# Patient Record
Sex: Female | Born: 1955 | Race: White | Hispanic: No | Marital: Married | Smoking: Former smoker
Health system: Southern US, Community
[De-identification: ages and names within clinical notes are randomized; demographics above are authoritative.]

## PROBLEM LIST (undated history)

## (undated) HISTORY — PX: OTHER SURGICAL HISTORY: SHX169

---

## 2012-02-02 ENCOUNTER — Emergency Department: Payer: Self-pay | Admitting: Internal Medicine

## 2012-09-12 ENCOUNTER — Encounter: Payer: Self-pay | Admitting: Family Medicine

## 2012-09-12 ENCOUNTER — Ambulatory Visit (INDEPENDENT_AMBULATORY_CARE_PROVIDER_SITE_OTHER): Payer: BC Managed Care – PPO | Admitting: Family Medicine

## 2012-09-12 VITALS — BP 130/90 | HR 76 | Temp 98.2°F | Ht 66.5 in | Wt 172.0 lb

## 2012-09-12 DIAGNOSIS — L989 Disorder of the skin and subcutaneous tissue, unspecified: Secondary | ICD-10-CM

## 2012-09-12 DIAGNOSIS — F988 Other specified behavioral and emotional disorders with onset usually occurring in childhood and adolescence: Secondary | ICD-10-CM

## 2012-09-12 DIAGNOSIS — K047 Periapical abscess without sinus: Secondary | ICD-10-CM | POA: Insufficient documentation

## 2012-09-12 MED ORDER — METRONIDAZOLE 500 MG PO TABS: 500.0000 mg | ORAL_TABLET | Freq: Three times a day (TID) | ORAL | Status: DC

## 2012-09-12 NOTE — Progress Notes (Signed)
  Subjective:    Patient ID: Robin Fry, female    DOB: 1955/09/03, 57 y.o.   MRN: [id number]  HPI  Very pleasant 57 yo female here to establish care.  ADD- has had formal eval.  On Adderral prescribed by psychiatrist in [place]. Diagnosed 8 years ago.  Symptoms well controlled.  Left dental abscess- multiple root canals and s/p tooth extraction, awaiting dental implant. Given clindamycin but caused HTN and nausea.  PCN allergic.  Afebrile but wonders if she should take something else.  Does have some pain still. No redness or warmth.  Brings in labs today.  CBC, lipid panel, CMET unremarkable.  Has GYN- pap smear normal- 04/2012. Colonoscopy normal in 2010.  Patient Active Problem List   Diagnosis Date Noted  . ADD (attention deficit disorder) 09/12/2012  . Dental abscess 09/12/2012   History reviewed. No pertinent past medical history. Past Surgical History  Procedure Laterality Date  . Novasure     History  Substance Use Topics  . Smoking status: Former Games developer  . Smokeless tobacco: Not on file  . Alcohol Use: Not on file   Family History  Problem Relation Age of Onset  . Osteoporosis Mother   . Diabetes Mother   . Alcohol abuse Father   . Heart disease Father 43    first MI at 84  . Diabetes Father    Allergies  Allergen Reactions  . Clindamycin/Lincomycin Nausea Only and Hypertension  . Penicillins Hives  . Tetracyclines & Related Hives   No current outpatient prescriptions on file prior to visit.   No current facility-administered medications on file prior to visit.   The PMH, PSH, Social History, Family History, Medications, and allergies have been reviewed in [facility], and have been updated if relevant.   Review of Systems See HPI No n/v/d     Objective:   Physical Exam BP 130/90  Pulse 76  Temp(Src) 98.2 F (36.8 C)  Ht 5' 6.5" (1.689 m)  Wt 172 lb (78.019 kg)  BMI 27.35 kg/m2   General:  Well-developed,well-nourished,in no acute distress;  alert,appropriate and cooperative throughout examination Head:  normocephalic and atraumatic.   Eyes:  vision grossly intact, pupils equal, pupils round, and pupils reactive to light.   Ears:  R ear normal and L ear normal.   Nose:  no external deformity.   Mouth:   Mild erythema on gum lower molar, otherwise unremarkable Lungs:  Normal respiratory effort, chest expands symmetrically. Lungs are clear to auscultation, no crackles or wheezes. Heart:  Normal rate and regular rhythm. S1 and S2 normal without gallop, murmur, click, rub or other extra sounds. Abdomen:  Bowel sounds positive,abdomen soft and non-tender without masses, organomegaly or hernias noted. Msk:  No deformity or scoliosis noted of thoracic or lumbar spine.   Extremities:  No clubbing, cyanosis, edema, or deformity noted with normal full range of motion of all joints.   Neurologic:  alert & oriented X3 and gait normal.   Skin:  Multiple AKs Cervical Nodes:  No lymphadenopathy noted Axillary Nodes:  No palpable lymphadenopathy Psych:  Cognition and judgment appear intact. Alert and cooperative with normal attention span and concentration. No apparent delusions, illusions, hallucinations     Assessment & Plan:  1. ADD (attention deficit disorder) Stable on current meds.  Refilled by psychiatry.  2. Dental abscess Will cover with course of Flagyl  3. Skin lesions, generalized Refer to derm. - Ambulatory referral to Dermatology

## 2012-09-12 NOTE — Patient Instructions (Addendum)
It was nice to meet you. Call me in a week or two with an update.  Please stop by to see Shirlee Limerick on your way out to set up dermatology your referral.

## 2013-02-12 ENCOUNTER — Encounter: Payer: Self-pay | Admitting: Internal Medicine

## 2013-02-12 ENCOUNTER — Ambulatory Visit (INDEPENDENT_AMBULATORY_CARE_PROVIDER_SITE_OTHER): Payer: BC Managed Care – PPO | Admitting: Internal Medicine

## 2013-02-12 VITALS — BP 118/80 | HR 106 | Temp 102.4°F | Ht 66.5 in | Wt 170.5 lb

## 2013-02-12 DIAGNOSIS — J069 Acute upper respiratory infection, unspecified: Secondary | ICD-10-CM

## 2013-02-12 LAB — POCT RAPID STREP A (OFFICE): Rapid Strep A Screen: NEGATIVE

## 2013-02-12 LAB — POCT INFLUENZA A/B: Influenza A, POC: NEGATIVE

## 2013-02-12 NOTE — Patient Instructions (Signed)

## 2013-02-12 NOTE — Progress Notes (Signed)
HPI  Pt presents to the clinic today with c/o cough, fatigue, headaches, body aches. The cough is productive with yellow sputum initially but then turns clear . She has been running fevers up to 102.4. This started 2 days ago. She has taken Nyquil, Vit C, Vit A, and ibuprofen- none of which seems to help. She does have a history of allergies and asthma. She has not had sick contacts that she is aware of but she did have recent airplane travel (sunday).  Review of Systems     History reviewed. No pertinent past medical history.  Family History  Problem Relation Age of Onset  . Osteoporosis Mother   . Diabetes Mother   . Alcohol abuse Father   . Heart disease Father 50    first MI at 30  . Diabetes Father     History   Social History  . Marital Status: Single    Spouse Name: N/A    Number of Children: N/A  . Years of Education: N/A   Occupational History  . Not on file.   Social History Main Topics  . Smoking status: Former Games developer  . Smokeless tobacco: Not on file  . Alcohol Use: Not on file  . Drug Use: Not on file  . Sexual Activity: Not on file   Other Topics Concern  . Not on file   Social History Narrative   Engaged.  Works in [employer].          Allergies  Allergen Reactions  . Clindamycin/Lincomycin Nausea Only and Hypertension  . Penicillins Hives  . Tetracyclines & Related Hives     Constitutional: Positive headache, fatigue and fever. Denies abrupt weight changes.  HEENT:  Positive sore throat. Denies eye redness, eye pain, pressure behind the eyes, facial pain, nasal congestion, ear pain, ringing in the ears, wax buildup, runny nose or bloody nose. Respiratory: Positive cough. Denies difficulty breathing or shortness of breath.  Cardiovascular: Denies chest pain, chest tightness, palpitations or swelling in the hands or feet.   No other specific complaints in a complete review of systems (except as listed in HPI above).  Objective:   BP 118/80   Pulse 106  Temp(Src) 102.4 F (39.1 C) (Oral)  Ht 5' 6.5" (1.689 m)  Wt 170 lb 8 oz (77.338 kg)  BMI 27.11 kg/m2  SpO2 98% Wt Readings from Last 3 Encounters:  02/12/13 170 lb 8 oz (77.338 kg)  09/12/12 172 lb (78.019 kg)     General: Appears her stated age, well developed, well nourished in NAD. HEENT: Head: normal shape and size; Eyes: sclera white, no icterus, conjunctiva pink, PERRLA and EOMs intact; Ears: Tm's gray and intact, normal light reflex; Nose: mucosa pink and moist, septum midline; Throat/Mouth: + PND. Teeth present, mucosa erythematous and moist, no exudate noted, no lesions or ulcerations noted.  Neck: Neck supple, trachea midline. No massses, lumps or thyromegaly present.  Cardiovascular: Normal rate and rhythm. S1,S2 noted.  No murmur, rubs or gallops noted. No JVD or BLE edema. No carotid bruits noted. Pulmonary/Chest: Normal effort and positive vesicular breath sounds. No respiratory distress. No wheezes, rales or ronchi noted.      Assessment & Plan:   Upper Respiratory Infection, likely viral:  Will obtain rapid flu-negative Get some rest and drink plenty of water Do salt water gargles for the sore throat Supportive care- tylenol, fluids, rest  RTC as needed or if symptoms persist > 7 days or seem to get worse

## 2013-02-25 ENCOUNTER — Encounter: Payer: Self-pay | Admitting: Family Medicine

## 2013-02-25 ENCOUNTER — Ambulatory Visit (INDEPENDENT_AMBULATORY_CARE_PROVIDER_SITE_OTHER): Payer: BC Managed Care – PPO | Admitting: Family Medicine

## 2013-02-25 VITALS — BP 114/80 | HR 79 | Temp 99.0°F | Ht 66.5 in | Wt 171.8 lb

## 2013-02-25 DIAGNOSIS — J019 Acute sinusitis, unspecified: Secondary | ICD-10-CM

## 2013-02-25 DIAGNOSIS — B9689 Other specified bacterial agents as the cause of diseases classified elsewhere: Secondary | ICD-10-CM | POA: Insufficient documentation

## 2013-02-25 MED ORDER — GUAIFENESIN-CODEINE 100-10 MG/5ML PO SYRP: 5.0000 mL | ORAL_SOLUTION | Freq: Four times a day (QID) | ORAL | Status: DC | PRN

## 2013-02-25 MED ORDER — BENZONATATE 200 MG PO CAPS: 200.0000 mg | ORAL_CAPSULE | Freq: Three times a day (TID) | ORAL | Status: DC | PRN

## 2013-02-25 MED ORDER — AZITHROMYCIN 250 MG PO TABS: ORAL_TABLET | ORAL | Status: DC

## 2013-02-25 NOTE — Progress Notes (Signed)
Subjective:    Patient ID: Robin Fry, female    DOB: 1955-05-16, 57 y.o.   MRN: [id number]  HPI Here for cough and uti symptoms  Was here 2 weeks ago - felt bad with cough and aches (saw Rene Kocher) - with temp of 102 -- then at home with 103  Neg flu test   Dx with viral syndrome  She rested and took care of herself  Took a trip/ (not out of the country)   Overall doing a lot better -but cough persists  Non prod cough Clear mucous  Temp 99 today but no fever symptoms   Took nyquil a few times  cepacol otc as well  Zinc  Vit C Lots of fluids  A little bit of sinus pain - under eyes with congestion   Patient Active Problem List   Diagnosis Date Noted  . Acute bacterial sinusitis 02/25/2013  . ADD (attention deficit disorder) 09/12/2012   No past medical history on file. Past Surgical History  Procedure Laterality Date  . Novasure     History  Substance Use Topics  . Smoking status: Former Games developer  . Smokeless tobacco: Not on file  . Alcohol Use: Not on file   Family History  Problem Relation Age of Onset  . Osteoporosis Mother   . Diabetes Mother   . Alcohol abuse Father   . Heart disease Father 42    first MI at 73  . Diabetes Father    Allergies  Allergen Reactions  . Clindamycin/Lincomycin Nausea Only and Hypertension  . Penicillins Hives  . Tetracyclines & Related Hives   Current Outpatient Prescriptions on File Prior to Visit  Medication Sig Dispense Refill  . amphetamine-dextroamphetamine (ADDERALL XR) 20 MG 24 hr capsule Take 20 mg by mouth every morning.      Marland Kitchen amphetamine-dextroamphetamine (ADDERALL) 5 MG tablet Take 2 by mouth every morning       No current facility-administered medications on file prior to visit.     Review of Systems Review of Systems  Constitutional: Negative for fever, appetite change, fatigue and unexpected weight change.  Eyes: Negative for pain and visual disturbance.  ENT pos for sinus pain and pressure/ neg for st or  ear pain Respiratory: Negative for wheeze  and shortness of breath.   Cardiovascular: Negative for cp or palpitations    Gastrointestinal: Negative for nausea, diarrhea and constipation.  Genitourinary: Negative for urgency and frequency.  Skin: Negative for pallor or rash   Neurological: Negative for weakness, light-headedness, numbness and headaches.  Hematological: Negative for adenopathy. Does not bruise/bleed easily.  Psychiatric/Behavioral: Negative for dysphoric mood. The patient is not nervous/anxious.         Objective:   Physical Exam  Constitutional: She appears well-developed and well-nourished. No distress.  HENT:  Head: Normocephalic and atraumatic.  Right Ear: External ear normal.  Left Ear: External ear normal.  Mouth/Throat: Oropharynx is clear and moist. No oropharyngeal exudate.  Nares are injected and congested  bilat maxillary sinus tenderness  Eyes: Conjunctivae and EOM are normal. Pupils are equal, round, and reactive to light. Right eye exhibits no discharge. Left eye exhibits no discharge.  Neck: Normal range of motion. Neck supple. No JVD present. Carotid bruit is not present. No thyromegaly present.  Cardiovascular: Normal rate and regular rhythm.   Pulmonary/Chest: Effort normal and breath sounds normal. No respiratory distress. She has no wheezes. She has no rales. She exhibits no tenderness.  Lymphadenopathy:    She has no  cervical adenopathy.  Neurological: She is alert. No cranial nerve deficit.  Skin: Skin is warm and dry. No rash noted. No pallor.  Psychiatric: She has a normal mood and affect.          Assessment & Plan:

## 2013-02-25 NOTE — Patient Instructions (Signed)
Drink lots of fluids and try to rest  For cough- use tessalon three times daily For bedtime - try the codeine cough med  Take zpak for sinus infection Update if not starting to improve in a week or if worsening

## 2013-02-26 NOTE — Assessment & Plan Note (Signed)
Cover with zpak Fluids/ rest / Disc symptomatic care - see instructions on AVS  Tessalon for cough  Update if not starting to improve in a week or if worsening

## 2013-03-19 ENCOUNTER — Other Ambulatory Visit: Payer: Self-pay

## 2014-01-19 ENCOUNTER — Ambulatory Visit: Payer: Self-pay | Admitting: Chiropractic Medicine

## 2014-03-08 ENCOUNTER — Ambulatory Visit (INDEPENDENT_AMBULATORY_CARE_PROVIDER_SITE_OTHER): Payer: BC Managed Care – PPO | Admitting: Family Medicine

## 2014-03-08 ENCOUNTER — Ambulatory Visit (INDEPENDENT_AMBULATORY_CARE_PROVIDER_SITE_OTHER)
Admission: RE | Admit: 2014-03-08 | Discharge: 2014-03-08 | Disposition: A | Discharge status: Home / Self Care | Payer: BC Managed Care – PPO | Source: Ambulatory Visit | Attending: Family Medicine | Admitting: Family Medicine

## 2014-03-08 ENCOUNTER — Encounter: Payer: Self-pay | Admitting: Family Medicine

## 2014-03-08 VITALS — BP 140/88 | HR 82 | Temp 98.6°F | Ht 66.5 in | Wt 177.8 lb

## 2014-03-08 DIAGNOSIS — J209 Acute bronchitis, unspecified: Secondary | ICD-10-CM

## 2014-03-08 MED ORDER — BENZONATATE 200 MG PO CAPS: 200.0000 mg | ORAL_CAPSULE | Freq: Three times a day (TID) | ORAL | Status: DC | PRN

## 2014-03-08 MED ORDER — AZITHROMYCIN 250 MG PO TABS: ORAL_TABLET | ORAL | Status: DC

## 2014-03-08 MED ORDER — HYDROCODONE-HOMATROPINE 5-1.5 MG/5ML PO SYRP: 5.0000 mL | ORAL_SOLUTION | Freq: Three times a day (TID) | ORAL | Status: DC | PRN

## 2014-03-08 NOTE — Progress Notes (Signed)
Pre visit review using our clinic review tool, if applicable. No additional management support is needed unless otherwise documented below in the visit note. 

## 2014-03-08 NOTE — Progress Notes (Signed)
Subjective:    Patient ID: Robin Fry, female    DOB: 1955/08/09, 58 y.o.   MRN: [id number]  HPI Here with uri symptoms for almost 2 weeks   Bad cough and lost voice  Throat is sore  Green/yellow with blood in sputum - straining to cough   Non smoker  A little second hand exp from fiancee Quit herself 30 years ago   Congested in sinuses as well  Some pressure over maxillary area-not severe  Runny nose too   Echanesia/ vitamins and zinc Went to [facility] and given cough syrup (dx with viral illness)- bromofed DM -no help She cannot take codeine   Patient Active Problem List   Diagnosis Date Noted  . Acute bronchitis 03/08/2014  . Acute bacterial sinusitis 02/25/2013  . ADD (attention deficit disorder) 09/12/2012   No past medical history on file. Past Surgical History  Procedure Laterality Date  . Novasure     History  Substance Use Topics  . Smoking status: Former Games developermoker  . Smokeless tobacco: Not on file  . Alcohol Use: No   Family History  Problem Relation Age of Onset  . Osteoporosis Mother   . Diabetes Mother   . Alcohol abuse Father   . Heart disease Father 3940    first MI at 7340  . Diabetes Father    Allergies  Allergen Reactions  . Clindamycin/Lincomycin Nausea Only and Hypertension  . Codeine     nightmares  . Penicillins Hives  . Tetracyclines & Related Hives   Current Outpatient Prescriptions on File Prior to Visit  Medication Sig Dispense Refill  . amphetamine-dextroamphetamine (ADDERALL XR) 20 MG 24 hr capsule Take 20 mg by mouth every morning.      Marland Kitchen. amphetamine-dextroamphetamine (ADDERALL) 5 MG tablet Take 2 by mouth every morning       No current facility-administered medications on file prior to visit.    Review of Systems Review of Systems  Constitutional: Negative for fever, appetite change,  and unexpected weight change.  ENT pos for cong and rhinorrhea Eyes: Negative for pain and visual disturbance.  Respiratory: Negative for  wheeze and shortness of breath.  pos for hemoptysis  Cardiovascular: Negative for cp or palpitations    Gastrointestinal: Negative for nausea, diarrhea and constipation.  Genitourinary: Negative for urgency and frequency.  Skin: Negative for pallor or rash   Neurological: Negative for weakness, light-headedness, numbness and headaches.  Hematological: Negative for adenopathy. Does not bruise/bleed easily.  Psychiatric/Behavioral: Negative for dysphoric mood. The patient is not nervous/anxious.         Objective:   Physical Exam  Constitutional: She appears well-developed and well-nourished. No distress.  HENT:  Head: Normocephalic and atraumatic.  Right Ear: External ear normal.  Left Ear: External ear normal.  Mouth/Throat: Oropharynx is clear and moist. No oropharyngeal exudate.  Nares are injected and congested  No sinus tenderness   Eyes: Conjunctivae and EOM are normal. Pupils are equal, round, and reactive to light. Right eye exhibits no discharge. Left eye exhibits no discharge.  Neck: Normal range of motion. Neck supple. No thyromegaly present.  Cardiovascular: Normal rate and regular rhythm.   Pulmonary/Chest: Effort normal and breath sounds normal. No respiratory distress. She has no wheezes. She has no rales. She exhibits no tenderness.  Harsh bs  Scattered rhonchi No wheeze   Lymphadenopathy:    She has no cervical adenopathy.  Neurological: She is alert.  Skin: Skin is warm and dry. No rash noted. No  erythema. No pallor.  Psychiatric: She has a normal mood and affect.          Assessment & Plan:   Problem List Items Addressed This Visit     Respiratory   Acute bronchitis - Primary     Due to hemoptysis (remote smoker)- check cxr today Cover with zithromax Hycodan for pm cough/warned of sedation , tessalon prn tid  Disc symptomatic care - see instructions on AVS  Update if not starting to improve in a week or if worsening      Relevant Orders      DG  Chest 2 View (Completed)

## 2014-03-08 NOTE — Assessment & Plan Note (Signed)
Due to hemoptysis (remote smoker)- check cxr today Cover with zithromax Hycodan for pm cough/warned of sedation , tessalon prn tid  Disc symptomatic care - see instructions on AVS  Update if not starting to improve in a week or if worsening

## 2014-03-08 NOTE — Patient Instructions (Signed)
Drink lots of fluids and rest  Chest xray today and we will get results to you  Take the zithromax as directed Tessalon for cough as needed  Hycodan syrup for cough as needed -watching out for sedation  Update if not starting to improve in a week or if worsening

## 2014-03-11 ENCOUNTER — Telehealth: Payer: Self-pay | Admitting: Family Medicine

## 2014-03-11 ENCOUNTER — Encounter: Payer: Self-pay | Admitting: Family Medicine

## 2014-03-11 MED ORDER — AZITHROMYCIN 250 MG PO TABS: ORAL_TABLET | ORAL | Status: DC

## 2014-03-11 NOTE — Telephone Encounter (Signed)
Patient Information:  Caller Name: Karoline Caldwellngie  Phone: [phone]  Patient: Robin Fry, Robin Fry  Gender: Female  DOB: [birth year]  Age: 89 Years  PCP: Roxy Mannsower, [facility]  Office Follow Up:  Does the office need to follow up with this patient?: Yes  Instructions For The Office: PLEASE CONTACT PT WITH PCP ADVICE ON ADDITIONAL MEDICATION  RN Note:  Pt was seen in office on 10/26.  She is calling back with symptoms of cough, very hoarse, bright yellow mucus.  Pt is concerned that she needs more antibiotic.  She will complete the Z-pack on 10/30 and feels like symptoms have improved while taking medication.  She is concerned because she plans to travel by airplane next week.   OFFICE NOTE:PT IS REQUESTING ADDITIONAL ANTIBIOTICS  Symptoms  Reason For Call & Symptoms: Hoarness, cough,  Reviewed Health History In EMR: Yes  Reviewed Medications In EMR: Yes  Reviewed Allergies In EMR: Yes  Reviewed Surgeries / Procedures: Yes  Date of Onset of Symptoms: 03/08/2014  Treatments Tried: vitamins and saline washes  Treatments Tried Worked: Yes  Guideline(s) Used:  Cough  Disposition Per Guideline:   See Within 3 Days in Office  Reason For Disposition Reached:   Nasal discharge present > 10 days  Advice Given:  Call Back If:  You become worse.  RN Overrode Recommendation:  Patient Requests Prescription  PT IS REQUESTING ADDITIONAL ANTIBIOTICS

## 2014-03-11 NOTE — Telephone Encounter (Signed)
Rx sent and I left voicemail letting pt know Rx sent but don't take it if she doesn't need it

## 2014-03-11 NOTE — Telephone Encounter (Signed)
Shapale will monitor response to notify pt.

## 2014-03-11 NOTE — Telephone Encounter (Signed)
Please call in another zpack- tell her not to take it if she does not need it  Thanks

## 2014-04-29 ENCOUNTER — Encounter: Payer: Self-pay | Admitting: Family Medicine

## 2014-04-29 ENCOUNTER — Ambulatory Visit (INDEPENDENT_AMBULATORY_CARE_PROVIDER_SITE_OTHER): Payer: BC Managed Care – PPO | Admitting: Family Medicine

## 2014-04-29 VITALS — BP 126/70 | HR 60 | Temp 98.0°F | Wt 180.0 lb

## 2014-04-29 DIAGNOSIS — G5601 Carpal tunnel syndrome, right upper limb: Secondary | ICD-10-CM

## 2014-04-29 DIAGNOSIS — G5602 Carpal tunnel syndrome, left upper limb: Secondary | ICD-10-CM

## 2014-04-29 DIAGNOSIS — G5600 Carpal tunnel syndrome, unspecified upper limb: Secondary | ICD-10-CM | POA: Insufficient documentation

## 2014-04-29 DIAGNOSIS — M791 Myalgia: Secondary | ICD-10-CM

## 2014-04-29 DIAGNOSIS — IMO0001 Reserved for inherently not codable concepts without codable children: Secondary | ICD-10-CM

## 2014-04-29 DIAGNOSIS — M609 Myositis, unspecified: Secondary | ICD-10-CM

## 2014-04-29 DIAGNOSIS — G5603 Carpal tunnel syndrome, bilateral upper limbs: Secondary | ICD-10-CM

## 2014-04-29 LAB — COMPREHENSIVE METABOLIC PANEL
ALT: 21 U/L (ref 0–35)
AST: 19 U/L (ref 0–37)
Albumin: 4.5 g/dL (ref 3.5–5.2)
Alkaline Phosphatase: 66 U/L (ref 39–117)
BILIRUBIN TOTAL: 0.6 mg/dL (ref 0.2–1.2)
BUN: 9 mg/dL (ref 6–23)
CALCIUM: 9.8 mg/dL (ref 8.4–10.5)
CHLORIDE: 106 meq/L (ref 96–112)
CO2: 28 meq/L (ref 19–32)
CREATININE: 0.8 mg/dL (ref 0.4–1.2)
GFR: 84.53 mL/min (ref >60.00)
GLUCOSE: 92 mg/dL (ref 70–99)
Potassium: 4 mEq/L (ref 3.5–5.1)
Sodium: 141 mEq/L (ref 135–145)
Total Protein: 7.1 g/dL (ref 6.0–8.3)

## 2014-04-29 LAB — C-REACTIVE PROTEIN: CRP: <0.5 mg/dL (ref 0.5–20.0)

## 2014-04-29 LAB — SEDIMENTATION RATE: SED RATE: 10 mm/h (ref 0–22)

## 2014-04-29 NOTE — Assessment & Plan Note (Signed)
Neck pain also likely due to spasm from her posture at work. Will check labs to rule out rheum issue. Orders Placed This Encounter  Procedures  . Sedimentation Rate  . Rheumatoid factor  . Cyclic Citrul Peptide Antibody, IGG  . C-reactive protein  . Comprehensive metabolic panel

## 2014-04-29 NOTE — Assessment & Plan Note (Signed)
New- discussed supportive care- hand out given from sports med advisor. Advised wrist splints bilaterally at night. Also given note to give to employer to make her station more ergonomic.  Call or return to clinic prn if these symptoms worsen or fail to improve as anticipated. The patient indicates understanding of these issues and agrees with the plan.

## 2014-04-29 NOTE — Patient Instructions (Signed)
Good to see you and happy holidays. I will call you with your lab results.

## 2014-04-29 NOTE — Progress Notes (Addendum)
Subjective:   Patient ID: Robin Fry, female    DOB: 1956/04/20, 58 y.o.   MRN: [id number]  Robin Fry is a pleasant 58 y.o. year old female who presents to clinic today with Generalized Body Aches  on 04/29/2014  HPI:  Has been waking up at night and morning past several months with pain/tingling in her forearms bilaterally and tingling in her fingers.  She can just "shake it out" typically.  Also has been having some neck and foot pain.  Massage has helped somewhat. +family history of OA, no other types of arthritis that she is aware of. No fevers, chills or night sweats.  No nausea or vomiting.  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  Current Outpatient Prescriptions on File Prior to Visit  Medication Sig Dispense Refill  . amphetamine-dextroamphetamine (ADDERALL XR) 20 MG 24 hr capsule Take 20 mg by mouth every morning.    Marland Kitchen. amphetamine-dextroamphetamine (ADDERALL) 5 MG tablet Take 2 by mouth every morning     No current facility-administered medications on file prior to visit.    Allergies  Allergen Reactions  . Clindamycin/Lincomycin Nausea Only and Hypertension  . Codeine     nightmares  . Penicillins Hives  . Tetracyclines & Related Hives    History reviewed. No pertinent past medical history.  Past Surgical History  Procedure Laterality Date  . Novasure      Family History  Problem Relation Age of Onset  . Osteoporosis Mother   . Diabetes Mother   . Alcohol abuse Father   . Heart disease Father 7240    first MI at 140  . Diabetes Father     History   Social History  . Marital Status: Single    Spouse Name: N/A    Number of Children: N/A  . Years of Education: N/A   Occupational History  . Not on file.   Social History Main Topics  . Smoking status: Former Games developermoker  . Smokeless tobacco: Not on file  . Alcohol Use:  No  . Drug Use: No  . Sexual Activity: Not on file   Other Topics Concern  . Not on file   Social History Narrative   Engaged.  Works in [employer].         The PMH, PSH, Social History, Family History, Medications, and allergies [facility], and have been updated if relevant.   Review of Systems  Constitutional: Negative for fever and fatigue.  HENT: Negative.   Eyes: Negative.   Respiratory: Negative.   Cardiovascular: Negative.   Gastrointestinal: Negative.   Endocrine: Negative.   Genitourinary: Negative.   Musculoskeletal: Positive for myalgias, back pain, arthralgias, neck pain and neck stiffness. Negative for joint swelling and gait problem.  Skin: Negative.   Neurological: Negative.   Hematological: Negative.   Psychiatric/Behavioral: Negative.   All other systems reviewed and are negative.      Objective:    BP 126/70 mmHg  Pulse 60  Temp(Src) 98 F (36.7 C) (Oral)  Wt 180 lb (81.647 kg)  SpO2 98%   Physical Exam  Constitutional: She is oriented to person, place, and time. She appears well-developed and well-nourished. No distress.  HENT:  Head: Normocephalic.  Eyes: Pupils are equal, round, and reactive to light.  Neck: Normal range of motion. Neck supple.  Cardiovascular:  Normal rate.   Pulmonary/Chest: Effort normal.  Musculoskeletal:       Cervical back: She exhibits spasm. She exhibits normal range of motion, no tenderness, no bony tenderness and no swelling.  +phalen, good grip strength bilaterally  Neurological: She is alert and oriented to person, place, and time. No cranial nerve deficit. Coordination normal.  Skin: Skin is warm and dry.  Psychiatric: She has a normal mood and affect. Her behavior is normal. Judgment and thought content normal.          Assessment & Plan:   Bilateral carpal tunnel syndrome  Myalgia and myositis - Plan: Sedimentation Rate, Rheumatoid factor, Cyclic Citrul Peptide Antibody, IGG, C-reactive  protein, Comprehensive metabolic panel No Follow-up on file.

## 2014-04-29 NOTE — Progress Notes (Signed)
Pre visit review using our clinic review tool, if applicable. No additional management support is needed unless otherwise documented below in the visit note. 

## 2014-04-30 LAB — RHEUMATOID FACTOR: Rhuematoid fact SerPl-aCnc: <10 IU/mL (ref <=14)

## 2014-04-30 LAB — CYCLIC CITRUL PEPTIDE ANTIBODY, IGG: Cyclic Citrullin Peptide Ab: <2 U/mL (ref 0.0–5.0)

## 2014-07-15 ENCOUNTER — Other Ambulatory Visit: Payer: Self-pay | Admitting: Family Medicine

## 2014-07-15 DIAGNOSIS — Z01419 Encounter for gynecological examination (general) (routine) without abnormal findings: Secondary | ICD-10-CM

## 2014-07-16 ENCOUNTER — Other Ambulatory Visit (INDEPENDENT_AMBULATORY_CARE_PROVIDER_SITE_OTHER): Payer: BLUE CROSS/BLUE SHIELD

## 2014-07-16 DIAGNOSIS — Z0000 Encounter for general adult medical examination without abnormal findings: Secondary | ICD-10-CM

## 2014-07-16 DIAGNOSIS — Z01419 Encounter for gynecological examination (general) (routine) without abnormal findings: Secondary | ICD-10-CM

## 2014-07-16 LAB — TSH: TSH: 2.32 u[IU]/mL (ref 0.35–4.50)

## 2014-07-16 LAB — COMPREHENSIVE METABOLIC PANEL
ALT: 24 U/L (ref 0–35)
AST: 20 U/L (ref 0–37)
Albumin: 4.5 g/dL (ref 3.5–5.2)
Alkaline Phosphatase: 66 U/L (ref 39–117)
BUN: 12 mg/dL (ref 6–23)
CALCIUM: 9.8 mg/dL (ref 8.4–10.5)
CHLORIDE: 104 meq/L (ref 96–112)
CO2: 30 meq/L (ref 19–32)
Creatinine, Ser: 0.79 mg/dL (ref 0.40–1.20)
GFR: 79.55 mL/min (ref >60.00)
Glucose, Bld: 91 mg/dL (ref 70–99)
Potassium: 4.6 mEq/L (ref 3.5–5.1)
Sodium: 138 mEq/L (ref 135–145)
Total Bilirubin: 0.7 mg/dL (ref 0.2–1.2)
Total Protein: 7 g/dL (ref 6.0–8.3)

## 2014-07-16 LAB — CBC WITH DIFFERENTIAL/PLATELET
BASOS ABS: 0 10*3/uL (ref 0.0–0.1)
Basophils Relative: 0.4 % (ref 0.0–3.0)
EOS PCT: 2.5 % (ref 0.0–5.0)
Eosinophils Absolute: 0.1 10*3/uL (ref 0.0–0.7)
HEMATOCRIT: 40.8 % (ref 36.0–46.0)
Hemoglobin: 14.1 g/dL (ref 12.0–15.0)
LYMPHS ABS: 1.8 10*3/uL (ref 0.7–4.0)
Lymphocytes Relative: 34.4 % (ref 12.0–46.0)
MCHC: 34.4 g/dL (ref 30.0–36.0)
MCV: 86.7 fl (ref 78.0–100.0)
Monocytes Absolute: 0.4 10*3/uL (ref 0.1–1.0)
Monocytes Relative: 8.8 % (ref 3.0–12.0)
NEUTROS PCT: 53.9 % (ref 43.0–77.0)
Neutro Abs: 2.8 10*3/uL (ref 1.4–7.7)
PLATELETS: 241 10*3/uL (ref 150.0–400.0)
RBC: 4.71 Mil/uL (ref 3.87–5.11)
RDW: 13.1 % (ref 11.5–15.5)
WBC: 5.1 10*3/uL (ref 4.0–10.5)

## 2014-07-16 LAB — LIPID PANEL
Cholesterol: 184 mg/dL (ref 0–200)
HDL: 49.1 mg/dL (ref >39.00)
LDL Cholesterol: 109 mg/dL — ABNORMAL HIGH (ref 0–99)
NONHDL: 134.9
Total CHOL/HDL Ratio: 4
Triglycerides: 130 mg/dL (ref 0.0–149.0)
VLDL: 26 mg/dL (ref 0.0–40.0)

## 2014-07-16 LAB — VITAMIN B12: Vitamin B-12: 365 pg/mL (ref 211–911)

## 2014-07-22 ENCOUNTER — Ambulatory Visit (INDEPENDENT_AMBULATORY_CARE_PROVIDER_SITE_OTHER): Payer: BLUE CROSS/BLUE SHIELD | Admitting: Family Medicine

## 2014-07-22 ENCOUNTER — Encounter: Payer: Self-pay | Admitting: Family Medicine

## 2014-07-22 VITALS — BP 128/78 | HR 67 | Temp 98.2°F | Ht 66.25 in | Wt 184.5 lb

## 2014-07-22 DIAGNOSIS — E66811 Obesity, class 1: Secondary | ICD-10-CM | POA: Insufficient documentation

## 2014-07-22 DIAGNOSIS — Z0000 Encounter for general adult medical examination without abnormal findings: Secondary | ICD-10-CM

## 2014-07-22 DIAGNOSIS — Z01419 Encounter for gynecological examination (general) (routine) without abnormal findings: Secondary | ICD-10-CM | POA: Insufficient documentation

## 2014-07-22 DIAGNOSIS — E669 Obesity, unspecified: Secondary | ICD-10-CM | POA: Insufficient documentation

## 2014-07-22 NOTE — Assessment & Plan Note (Signed)
Reviewed preventive care protocols, scheduled due services, and updated immunizations Discussed nutrition, exercise, diet, and healthy lifestyle.  Declines influenza vaccine. 

## 2014-07-22 NOTE — Progress Notes (Signed)
   Subjective:   Patient ID: Arnoldo Moralengie Sandlin, female    DOB: 11-10-1955, 59 y.o.   MRN: [id number]  Arnoldo Moralengie Czerwinski is a pleasant 59 y.o. year old female who presents to clinic today with Annual Exam  on 07/22/2014  HPI:  Has a GYN- sees him yearly in [place]- last saw him in September 2015. Remote h/o ablation. Due for mammogram in 01/2015.  Colonoscopy done 6 year ago per pt, in [place]- was normal.  Weight gain- Stress eater and has been under more stress lately at work and at home.  Also not exercising at all.  Works from early in the morning until evening without leaving her desk.  Feeling "fat" and she knows it affects her mood too.  Denies feeling overtly depressed or anxious but it does make her feel more "down."  Used to run or walk her dogs regularly.  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  Plans on talking with her employer to get a new keyboard.  Lab Results  Component Value Date   WBC 5.1 07/16/2014   HGB 14.1 07/16/2014   HCT 40.8 07/16/2014   MCV 86.7 07/16/2014   PLT 241.0 07/16/2014   Lab Results  Component Value Date   CREATININE 0.79 07/16/2014   Lab Results  Component Value Date   TSH 2.32 07/16/2014   Lab Results  Component Value Date   CHOL 184 07/16/2014   HDL 49.10 07/16/2014   LDLCALC 109* 07/16/2014   TRIG 130.0 07/16/2014   CHOLHDL 4 07/16/2014   Lab Results  Component Value Date   ALT 24 07/16/2014   AST 20 07/16/2014   ALKPHOS 66 07/16/2014   BILITOT 0.7 07/16/2014     . Review of Systems  Constitutional: Negative.   HENT: Negative.   Eyes: Negative.   Respiratory: Negative.   Cardiovascular: Negative.   Gastrointestinal: Negative.   Genitourinary: Negative.   Musculoskeletal: Positive for arthralgias.  Skin: Negative.   Allergic/Immunologic: Negative.   Neurological: Negative.   Hematological: Negative.     Psychiatric/Behavioral: Negative.   All other systems reviewed and are negative.      Objective:    BP 128/78 mmHg  Pulse 67  Temp(Src) 98.2 F (36.8 C) (Oral)  Ht 5' 6.25" (1.683 m)  Wt 184 lb 8 oz (83.689 kg)  BMI 29.55 kg/m2  SpO2 97%   Physical Exam  Constitutional: She is oriented to person, place, and time. She appears well-developed and well-nourished. No distress.  HENT:  Head: Normocephalic.  Eyes: Conjunctivae are normal.  Neck: Normal range of motion. Neck supple. No thyromegaly present.  Cardiovascular: Normal rate and regular rhythm.   Pulmonary/Chest: Effort normal and breath sounds normal. No respiratory distress.  Abdominal: Soft.  Musculoskeletal: Normal range of motion.  Neurological: She is alert and oriented to person, place, and time. No cranial nerve deficit.  Skin: Skin is warm and dry.  Psychiatric: She has a normal mood and affect. Her behavior is normal. Judgment and thought content normal.  Nursing note and vitals reviewed.         Assessment & Plan:   Well woman exam No Follow-up on file.

## 2014-07-22 NOTE — Patient Instructions (Addendum)
Good to see you. Please look for you doctor's information who did colonoscopy when you get home and call us back.  Doctor's orders-  You must exercise at least 30 minutes a day at lunch.  Please start taking a Vit B12 supplement- 500 - 1000 mcg per day.

## 2014-07-22 NOTE — Assessment & Plan Note (Signed)
Discussed scheduling exercise into her schedule at work every day- 30 minutes minimum at lunch.

## 2014-07-22 NOTE — Progress Notes (Signed)
Pre visit review using our clinic review tool, if applicable. No additional management support is needed unless otherwise documented below in the visit note. 

## 2014-11-19 ENCOUNTER — Telehealth: Payer: Self-pay | Admitting: Family Medicine

## 2014-11-19 NOTE — Telephone Encounter (Signed)
Made 30 min

## 2014-11-19 NOTE — Telephone Encounter (Signed)
Yes please make it a 30 minute appt. Thanks.

## 2014-11-19 NOTE — Telephone Encounter (Signed)
Dr Dayton MartesAron Ms Robin HoardBoone called wanting to make and appointment.  Stated it was personal, she stated it would not take long.  I made her a 15 min appointment on 11/25/14.  That's all you had.  Do you want me to make it a 30 min appointment Thanks  robin

## 2014-11-25 ENCOUNTER — Encounter: Payer: Self-pay | Admitting: Family Medicine

## 2014-11-25 ENCOUNTER — Ambulatory Visit (INDEPENDENT_AMBULATORY_CARE_PROVIDER_SITE_OTHER): Payer: BLUE CROSS/BLUE SHIELD | Admitting: Family Medicine

## 2014-11-25 VITALS — BP 130/82 | HR 68 | Temp 98.3°F | Wt 181.0 lb

## 2014-11-25 DIAGNOSIS — F329 Major depressive disorder, single episode, unspecified: Secondary | ICD-10-CM | POA: Diagnosis not present

## 2014-11-25 DIAGNOSIS — R5383 Other fatigue: Secondary | ICD-10-CM | POA: Diagnosis not present

## 2014-11-25 DIAGNOSIS — F909 Attention-deficit hyperactivity disorder, unspecified type: Secondary | ICD-10-CM | POA: Diagnosis not present

## 2014-11-25 DIAGNOSIS — N951 Menopausal and female climacteric states: Secondary | ICD-10-CM

## 2014-11-25 DIAGNOSIS — F32A Depression, unspecified: Secondary | ICD-10-CM

## 2014-11-25 DIAGNOSIS — F988 Other specified behavioral and emotional disorders with onset usually occurring in childhood and adolescence: Secondary | ICD-10-CM

## 2014-11-25 LAB — CBC WITH DIFFERENTIAL/PLATELET
BASOS ABS: 0 10*3/uL (ref 0.0–0.1)
BASOS PCT: 0.3 % (ref 0.0–3.0)
EOS PCT: 2.2 % (ref 0.0–5.0)
Eosinophils Absolute: 0.1 10*3/uL (ref 0.0–0.7)
HEMATOCRIT: 43.9 % (ref 36.0–46.0)
Hemoglobin: 14.9 g/dL (ref 12.0–15.0)
Lymphocytes Relative: 33.5 % (ref 12.0–46.0)
Lymphs Abs: 2.1 10*3/uL (ref 0.7–4.0)
MCHC: 33.9 g/dL (ref 30.0–36.0)
MCV: 87.8 fl (ref 78.0–100.0)
MONO ABS: 0.4 10*3/uL (ref 0.1–1.0)
Monocytes Relative: 6.3 % (ref 3.0–12.0)
NEUTROS ABS: 3.6 10*3/uL (ref 1.4–7.7)
Neutrophils Relative %: 57.7 % (ref 43.0–77.0)
PLATELETS: 279 10*3/uL (ref 150.0–400.0)
RBC: 4.99 Mil/uL (ref 3.87–5.11)
RDW: 12.9 % (ref 11.5–15.5)
WBC: 6.2 10*3/uL (ref 4.0–10.5)

## 2014-11-25 LAB — LUTEINIZING HORMONE: LH: 29.63 m[IU]/mL

## 2014-11-25 LAB — TSH: TSH: 2.68 u[IU]/mL (ref 0.35–4.50)

## 2014-11-25 LAB — VITAMIN B12: Vitamin B-12: 474 pg/mL (ref 211–911)

## 2014-11-25 LAB — FOLLICLE STIMULATING HORMONE: FSH: 113.1 m[IU]/mL

## 2014-11-25 MED ORDER — AMPHETAMINE-DEXTROAMPHET ER 20 MG PO CP24: 20.0000 mg | ORAL_CAPSULE | ORAL | Status: DC

## 2014-11-25 MED ORDER — AMPHETAMINE-DEXTROAMPHETAMINE 5 MG PO TABS: ORAL_TABLET | ORAL | Status: AC

## 2014-11-25 NOTE — Patient Instructions (Signed)
Great to see you. Please restart Adderall.  Please call to make an appointment with a new psychiatrist.  We will call you with your lab results.

## 2014-11-25 NOTE — Assessment & Plan Note (Signed)
Deteriorated off rx. Discussed importance of getting managed by one provider.  Since she is looking for new psychiatrist, will refill one time only. The patient indicates understanding of these issues and agrees with the plan.

## 2014-11-25 NOTE — Progress Notes (Signed)
Pre visit review using our clinic review tool, if applicable. No additional management support is needed unless otherwise documented below in the visit note. 

## 2014-11-25 NOTE — Assessment & Plan Note (Signed)
With fatigue, symptoms of depression. >25 minutes spent in face to face time with patient, >50% spent in counselling or coordination of care Agreed to check labs today, discussed starting lexapro for vasomotor symptoms which will will not do yet.  Hopefully restarting adderall will help as well. Call or return to clinic prn if these symptoms worsen or fail to improve as anticipated. The patient indicates understanding of these issues and agrees with the plan.

## 2014-11-25 NOTE — Progress Notes (Signed)
Subjective:   Patient ID: Robin Fry, female    DOB: [birth year], 59 y.o.   MRN: [id number]  Robin Fry is a pleasant 59 y.o. year old female who presents to clinic today with Fatigue and lack of motivation  on 11/25/2014  HPI:  Past few months, fatigue, anhedonia, hot flashes and vaginal dryness.  More anxious and tearful.  Not concentrating well but ran out of her Adderall and her psychiatrist as left her practice.  She is looking for another one currently.  Not sleeping well. She does not have a personal or family history of anxiety or depression.  No SI or HI.  Appetite is good.  Not having periods- remote h/o ablation.  Current Outpatient Prescriptions on File Prior to Visit  Medication Sig Dispense Refill  . Eflornithine HCl (VANIQA) 13.9 % cream Apply topically 2 (two) times daily with a meal.     No current facility-administered medications on file prior to visit.    Allergies  Allergen Reactions  . Clindamycin/Lincomycin Nausea Only and Hypertension  . Codeine     nightmares  . Penicillins Hives  . Tetracyclines & Related Hives    No past medical history on file.  Past Surgical History  Procedure Laterality Date  . Novasure      Family History  Problem Relation Age of Onset  . Osteoporosis Mother   . Diabetes Mother   . Alcohol abuse Father   . Heart disease Father 15    first MI at 14  . Diabetes Father     History   Social History  . Marital Status: Single    Spouse Name: N/A  . Number of Children: N/A  . Years of Education: N/A   Occupational History  . Not on file.   Social History Main Topics  . Smoking status: Former Games developer  . Smokeless tobacco: Not on file  . Alcohol Use: No  . Drug Use: No  . Sexual Activity: Not on file   Other Topics Concern  . Not on file   Social History Narrative   Engaged.  Works in [employer].         The PMH, PSH, Social History, Family History, Medications, and allergies have been reviewed in [facility],  and have been updated if relevant.  Review of Systems  Constitutional: Positive for fatigue.  Eyes: Negative.   Respiratory: Negative.   Gastrointestinal: Negative.   Genitourinary: Negative for vaginal bleeding and vaginal pain.  Psychiatric/Behavioral: Positive for sleep disturbance, dysphoric mood and decreased concentration. Negative for suicidal ideas, behavioral problems and confusion. The patient is nervous/anxious.   All other systems reviewed and are negative.      Objective:    BP 130/82 mmHg  Pulse 68  Temp(Src) 98.3 F (36.8 C) (Oral)  Wt 181 lb (82.101 kg)  SpO2 98%   Physical Exam  Constitutional: She is oriented to person, place, and time. She appears well-developed and well-nourished. No distress.  HENT:  Head: Normocephalic.  Eyes: Conjunctivae are normal.  Neck: Normal range of motion.  Cardiovascular: Normal rate, regular rhythm and normal heart sounds.   Pulmonary/Chest: Effort normal and breath sounds normal. No respiratory distress. She has no wheezes.  Musculoskeletal: She exhibits no edema.  Neurological: She is alert and oriented to person, place, and time. No cranial nerve deficit.  Skin: Skin is warm and dry.  Psychiatric: She has a normal mood and affect. Her behavior is normal. Judgment and thought content normal.  Nursing note and  vitals reviewed.         Assessment & Plan:   Fatigue due to depression - Plan: TSH, CBC with Differential/Platelet, Vitamin B12  ADD (attention deficit disorder)  Menopausal symptoms - Plan: Luteinizing hormone, Follicle Stimulating Hormone No Follow-up on file.

## 2014-11-26 ENCOUNTER — Encounter: Payer: Self-pay | Admitting: Family Medicine

## 2014-12-07 ENCOUNTER — Other Ambulatory Visit: Payer: Self-pay | Admitting: Family Medicine

## 2014-12-07 MED ORDER — ESCITALOPRAM OXALATE 10 MG PO TABS: 10.0000 mg | ORAL_TABLET | Freq: Every day | ORAL | Status: DC

## 2015-01-10 ENCOUNTER — Telehealth: Payer: Self-pay

## 2015-01-10 NOTE — Telephone Encounter (Signed)
Called patient to remind her of being due for a Mammogram. I was unable to leave a voicemail on both cell and home phone numbers.

## 2015-01-11 ENCOUNTER — Encounter: Payer: Self-pay | Admitting: Family Medicine

## 2015-01-28 ENCOUNTER — Other Ambulatory Visit: Payer: Self-pay | Admitting: *Deleted

## 2015-01-28 MED ORDER — ESCITALOPRAM OXALATE 10 MG PO TABS: 10.0000 mg | ORAL_TABLET | Freq: Every day | ORAL | Status: DC

## 2015-01-28 NOTE — Addendum Note (Signed)
Addended by: Patience Musca on: 01/28/2015 12:52 PM   Modules accepted: Orders

## 2015-01-28 NOTE — Telephone Encounter (Addendum)
[facility] left v/m t;hat ins is requiring 90 day rx for pt;[facility] d/c # 30 x 5 on escitalopram and recent # 90 X 1 per protocol.

## 2015-07-06 ENCOUNTER — Encounter: Payer: Self-pay | Admitting: Family Medicine

## 2015-07-06 ENCOUNTER — Ambulatory Visit (INDEPENDENT_AMBULATORY_CARE_PROVIDER_SITE_OTHER)
Admission: RE | Admit: 2015-07-06 | Discharge: 2015-07-06 | Disposition: A | Discharge status: Home / Self Care | Payer: BLUE CROSS/BLUE SHIELD | Source: Ambulatory Visit | Attending: Family Medicine | Admitting: Family Medicine

## 2015-07-06 ENCOUNTER — Ambulatory Visit
Admission: RE | Admit: 2015-07-06 | Discharge: 2015-07-06 | Disposition: A | Discharge status: Home / Self Care | Payer: BLUE CROSS/BLUE SHIELD | Source: Ambulatory Visit | Attending: Family Medicine | Admitting: Family Medicine

## 2015-07-06 ENCOUNTER — Ambulatory Visit (INDEPENDENT_AMBULATORY_CARE_PROVIDER_SITE_OTHER): Payer: BLUE CROSS/BLUE SHIELD | Admitting: Family Medicine

## 2015-07-06 VITALS — BP 120/80 | HR 76 | Temp 98.5°F | Ht 66.25 in | Wt 174.5 lb

## 2015-07-06 DIAGNOSIS — M25561 Pain in right knee: Secondary | ICD-10-CM | POA: Diagnosis not present

## 2015-07-06 DIAGNOSIS — M25562 Pain in left knee: Secondary | ICD-10-CM

## 2015-07-06 DIAGNOSIS — M7651 Patellar tendinitis, right knee: Secondary | ICD-10-CM | POA: Diagnosis not present

## 2015-07-06 MED ORDER — DICLOFENAC SODIUM 75 MG PO TBEC: 75.0000 mg | DELAYED_RELEASE_TABLET | Freq: Two times a day (BID) | ORAL | Status: DC

## 2015-07-06 NOTE — Progress Notes (Signed)
Dr. Karleen Hampshire T. Nahome Bublitz, MD, CAQ Sports Medicine Primary Care and Sports Medicine [address] [place], [address] Phone: [phone] Fax: [phone]  07/06/2015  Patient: Robin Fry, MRN: [id number], DOB: 1956-03-14, 60 y.o.  Primary Physician:  Ruthe Mannan, MD   Chief Complaint  Patient presents with  . Knee Pain    Bilateral   Subjective:   Robin Fry is a 60 y.o. very pleasant female patient who presents with the following:  A few weeks ago and kneeling down in the anterior. Does not hurt that much - will sometimes get hot.  She primarily has HEENT directly in the anterior aspect of her knee on the right. She has not had any effusion. No mechanical buckling or giving way. When she is having it flexing when she is directly on her knee, doesn't really bother her too much. She is not having the pain in the joint lines. No pain in the patella itself.  Pilates / yoga Crossfit once a week.  R knee, distal patellar insertion injury / tendonitis  Past Medical History, Surgical History, Social History, Family History, Problem List, Medications, and Allergies have been reviewed and updated if relevant.  Patient Active Problem List   Diagnosis Date Noted  . Fatigue 11/25/2014  . Menopausal symptoms 11/25/2014  . Well woman exam 07/22/2014  . Obesity (BMI 30.0-34.9) 07/22/2014  . Myalgia and myositis 04/29/2014  . Carpal tunnel syndrome 04/29/2014  . ADD (attention deficit disorder) 09/12/2012    No past medical history on file.  Past Surgical History  Procedure Laterality Date  . Novasure      Social History   Social History  . Marital Status: Single    Spouse Name: N/A  . Number of Children: N/A  . Years of Education: N/A   Occupational History  . Not on file.   Social History Main Topics  . Smoking status: Former Games developer  . Smokeless tobacco: Never Used  . Alcohol Use: No  . Drug Use: No  . Sexual Activity: Not on file   Other Topics Concern  .  Not on file   Social History Narrative   Engaged.  Works in [employer].          Family History  Problem Relation Age of Onset  . Osteoporosis Mother   . Diabetes Mother   . Alcohol abuse Father   . Heart disease Father 39    first MI at 77  . Diabetes Father     Allergies  Allergen Reactions  . Clindamycin/Lincomycin Nausea Only and Hypertension  . Codeine     nightmares  . Penicillins Hives  . Tetracyclines & Related Hives    Medication list reviewed and updated in full in [facility].  GEN: No fevers, chills. Nontoxic. Primarily MSK c/o today. MSK: Detailed in the HPI GI: tolerating PO intake without difficulty Neuro: No numbness, parasthesias, or tingling associated. Otherwise the pertinent positives of the ROS are noted above.   Objective:   BP 120/80 mmHg  Pulse 76  Temp(Src) 98.5 F (36.9 C) (Oral)  Ht 5' 6.25" (1.683 m)  Wt 174 lb 8 oz (79.153 kg)  BMI 27.94 kg/m2   GEN: WDWN, NAD, Non-toxic, Alert & Oriented x 3 HEENT: Atraumatic, Normocephalic.  Ears and Nose: No external deformity. EXTR: No clubbing/cyanosis/edema NEURO: Normal gait.  PSYCH: Normally interactive. Conversant. Not depressed or anxious appearing.  Calm demeanor.   Knee:  R Gait: Normal heel toe pattern ROM: 0 -  130 Effusion: neg Echymosis or edema: none Patellar tendon  Tender to palpation at the most distal aspect adjacent to the tibial tubercle Painful PLICA: neg Patellar grind: negative Medial and lateral patellar facet loading: negative medial and lateral joint lines:NT Mcmurray's neg Flexion-pinch neg Varus and valgus stress: stable Lachman: neg Ant and Post drawer: neg Hip abduction, IR, ER: WNL Hip flexion str: 5/5 Hip abd: 5/5 Quad: 5/5 VMO atrophy:No Hamstring concentric and eccentric: 5/5   Radiology:  Assessment and Plan:   Patellar tendinitis of right knee  Bilateral knee pain - Plan: DG Knee AP/LAT W/Sunrise Left, DG Knee AP/LAT W/Sunrise  Right   most likely , I think based on the patient's history and clinical exam that she has injured her patellar tendon at the distal insertion , probably mildly. This may be a small horizontal tear or small insertional tear. Likely this will improve with time. I gave her a rehabilitation program with eccentric's and asked her to avoid ballistic movements.  I reviewed with the patient a handout from [employer] and [facility] regarding their condition and a set of home exercises.    Follow-up: if not better in 1 month  New Prescriptions   DICLOFENAC (VOLTAREN) 75 MG EC TABLET    Take 1 tablet (75 mg total) by mouth 2 (two) times daily.   Modified Medications   No medications on file   Orders Placed This Encounter  Procedures  . DG Knee AP/LAT W/Sunrise Left  . DG Knee AP/LAT W/Sunrise Right    Signed,  Melanie Openshaw T. Khiyan Crace, MD   Patient's Medications  New Prescriptions   DICLOFENAC (VOLTAREN) 75 MG EC TABLET    Take 1 tablet (75 mg total) by mouth 2 (two) times daily.  Previous Medications   AMPHETAMINE-DEXTROAMPHETAMINE (ADDERALL XR) 20 MG 24 HR CAPSULE    Take 1 capsule (20 mg total) by mouth every morning.   AMPHETAMINE-DEXTROAMPHETAMINE (ADDERALL) 5 MG TABLET    Take 2 by mouth every morning   CONJ ESTROGENS-BAZEDOXIFENE (DUAVEE) 0.45-20 MG TABS    Take 1 tablet by mouth daily.  Modified Medications   No medications on file  Discontinued Medications   EFLORNITHINE HCL (VANIQA) 13.9 % CREAM    Apply topically 2 (two) times daily with a meal.   ESCITALOPRAM (LEXAPRO) 10 MG TABLET    Take 1 tablet (10 mg total) by mouth daily.

## 2015-07-06 NOTE — Progress Notes (Signed)
Pre visit review using our clinic review tool, if applicable. No additional management support is needed unless otherwise documented below in the visit note. 

## 2015-10-24 ENCOUNTER — Encounter: Payer: Self-pay | Admitting: Family Medicine

## 2015-10-24 ENCOUNTER — Ambulatory Visit (INDEPENDENT_AMBULATORY_CARE_PROVIDER_SITE_OTHER): Payer: BLUE CROSS/BLUE SHIELD | Admitting: Family Medicine

## 2015-10-24 VITALS — BP 114/70 | HR 78 | Temp 98.5°F | Ht 66.25 in | Wt 174.2 lb

## 2015-10-24 DIAGNOSIS — R05 Cough: Secondary | ICD-10-CM

## 2015-10-24 DIAGNOSIS — J069 Acute upper respiratory infection, unspecified: Secondary | ICD-10-CM

## 2015-10-24 DIAGNOSIS — R059 Cough, unspecified: Secondary | ICD-10-CM

## 2015-10-24 MED ORDER — BENZONATATE 100 MG PO CAPS: 100.0000 mg | ORAL_CAPSULE | Freq: Three times a day (TID) | ORAL | Status: DC | PRN

## 2015-10-24 MED ORDER — AZITHROMYCIN 250 MG PO TABS: ORAL_TABLET | ORAL | Status: DC

## 2015-10-24 NOTE — Progress Notes (Signed)
Dr. Karleen HampshireSpencer T. Nesa Distel, MD, CAQ Sports Medicine Primary Care and Sports Medicine [address] [place], [id number] Phone: [phone] Fax: [phone]  10/24/2015  Patient: Arnoldo Moralengie Hauss, MRN: [id number], DOB: Oct 01, 1955, 60 y.o.  Primary Physician:  Ruthe Mannanalia Aron, MD   Chief Complaint  Patient presents with  . Cough    yellow/greenish pheglm  . Sore Throat  . Ear Pain   Subjective:   Karoline Caldwellngie Lissa HoardBoone is a 60 y.o. very pleasant female patient who presents with the following:  Patient presents for presents evaluation of myalgias, nasal congestion, productive cough, rhinorrhea , sneezing and sore throat. Symptoms began > 1 week ago and are gradually worsening since that time.   Travelling to [place], and back for 1 week and then started to get worse.  Tried a lot of OTC remedies.  Tried neti pot.  Tried tea, ginger, honey.   Still feels really bad - not sleeping.  Coughing up yellow, greenish stuff.  The patient denies significant nausea, vomitting, diarrhea, rash, diffuse arthralgia or myalgia. They also deny high fever.   Past Medical History, Surgical History, Social History, Family History, Problem List, Medications, and Allergies have been reviewed and updated if relevant.  Patient Active Problem List   Diagnosis Date Noted  . Fatigue 11/25/2014  . Menopausal symptoms 11/25/2014  . Well woman exam 07/22/2014  . Obesity (BMI 30.0-34.9) 07/22/2014  . Myalgia and myositis 04/29/2014  . Carpal tunnel syndrome 04/29/2014  . ADD (attention deficit disorder) 09/12/2012    No past medical history on file.  Past Surgical History  Procedure Laterality Date  . Novasure      Social History   Social History  . Marital Status: Single    Spouse Name: N/A  . Number of Children: N/A  . Years of Education: N/A   Occupational History  . Not on file.   Social History Main Topics  . Smoking status: Former Games developermoker  . Smokeless tobacco: Never Used  . Alcohol Use: No  .  Drug Use: No  . Sexual Activity: Not on file   Other Topics Concern  . Not on file   Social History Narrative   Engaged.  Works in [employer].          Family History  Problem Relation Age of Onset  . Osteoporosis Mother   . Diabetes Mother   . Alcohol abuse Father   . Heart disease Father 5540    first MI at 4840  . Diabetes Father     Allergies  Allergen Reactions  . Clindamycin/Lincomycin Nausea Only and Hypertension  . Codeine     nightmares  . Penicillins Hives  . Tetracyclines & Related Hives    Medication list reviewed and updated in full in [facility].  ROS: GEN: Acute illness details above GI: Tolerating PO intake GU: maintaining adequate hydration and urination Pulm: No SOB Interactive and getting along well at home.  Otherwise, ROS is as per the HPI.  Objective:   BP 114/70 mmHg  Pulse 78  Temp(Src) 98.5 F (36.9 C) (Oral)  Ht 5' 6.25" (1.683 m)  Wt 174 lb 4 oz (79.039 kg)  BMI 27.90 kg/m2  SpO2 97%   GEN: A and O x 3. WDWN. NAD.    ENT: Nose clear, ext NML.  No LAD.  No JVD.  TM's clear. Oropharynx clear.  PULM: Normal WOB, no distress. No crackles, wheezes, rhonchi. CV: RRR, no M/G/R, No rubs, No JVD.   EXT:  warm and well-perfused, No c/c/e. PSYCH: Pleasant and conversant.   Laboratory and Imaging Data: No results found.  Assessment and Plan:   Cough  URI (upper respiratory infection)  At this point, we reviewed supportive care. Given the length of time and risk factors, treat with medications below. +/- can hold abx to see if clears  Medical decision making includes all plans, orders, medications, and patient instructions reviewed face to face.   Follow-up: No Follow-up on file.  New Prescriptions   AZITHROMYCIN (ZITHROMAX) 250 MG TABLET    2 tabs po on day 1, then 1 tab po for 4 days   BENZONATATE (TESSALON) 100 MG CAPSULE    Take 1 capsule (100 mg total) by mouth 3 (three) times daily as needed for cough.   Modified  Medications   No medications on file   No orders of the defined types were placed in this encounter.    Signed,  Elpidio Galea. Darleth Eustache, MD   Patient's Medications  New Prescriptions   AZITHROMYCIN (ZITHROMAX) 250 MG TABLET    2 tabs po on day 1, then 1 tab po for 4 days   BENZONATATE (TESSALON) 100 MG CAPSULE    Take 1 capsule (100 mg total) by mouth 3 (three) times daily as needed for cough.  Previous Medications   AMPHETAMINE-DEXTROAMPHETAMINE (ADDERALL XR) 20 MG 24 HR CAPSULE    Take 1 capsule (20 mg total) by mouth every morning.   AMPHETAMINE-DEXTROAMPHETAMINE (ADDERALL) 5 MG TABLET    Take 2 by mouth every morning   CONJ ESTROGENS-BAZEDOXIFENE (DUAVEE) 0.45-20 MG TABS    Take 1 tablet by mouth daily.  Modified Medications   No medications on file  Discontinued Medications   DICLOFENAC (VOLTAREN) 75 MG EC TABLET    Take 1 tablet (75 mg total) by mouth 2 (two) times daily.

## 2015-10-24 NOTE — Progress Notes (Signed)
Pre visit review using our clinic review tool, if applicable. No additional management support is needed unless otherwise documented below in the visit note. 

## 2016-01-17 DIAGNOSIS — N898 Other specified noninflammatory disorders of vagina: Secondary | ICD-10-CM | POA: Diagnosis not present

## 2016-01-20 ENCOUNTER — Encounter: Payer: Self-pay | Admitting: Family Medicine

## 2016-01-20 ENCOUNTER — Ambulatory Visit (INDEPENDENT_AMBULATORY_CARE_PROVIDER_SITE_OTHER): Payer: BLUE CROSS/BLUE SHIELD | Admitting: Family Medicine

## 2016-01-20 DIAGNOSIS — M25562 Pain in left knee: Secondary | ICD-10-CM

## 2016-01-20 NOTE — Progress Notes (Signed)
Pre visit review using our clinic review tool, if applicable. No additional management support is needed unless otherwise documented below in the visit note. 

## 2016-01-20 NOTE — Patient Instructions (Signed)
Ibuprofen 400mg  3 times a day with food.  Ice and try a knee sleeve.  Should gradually get better.  Update us as needed.  Take care.  Glad to see you.

## 2016-01-20 NOTE — Progress Notes (Signed)
L knee pain.  She wanted to get back to running to lose some weight.  Was walk/running.  Felt sharp pain in L knee last night.  Used ice and ibuprofen.  No trauma.  No bruising.  The knee didn't give out.  She could still bear weight.  Knee didn't swell.  Knee didn't lock.  Some pain with full flexion but not full extension.  Pain with weight bearing.    She didn't hear a pop.  H/o ACL partial tear years ago per patient report, no surgery.    Meds, vitals, and allergies reviewed.   ROS: Per HPI unless specifically indicated in ROS section   No apparent distress. Left knee with normal inspection. Not puffy, not bruised, not red. Normal range of motion, no locking or clicking. Anterior cruciate ligament, MCL, LCL all feel solid on testing.  Able to bear weight. She does have some discomfort with testing the lateral meniscus but no locking.

## 2016-01-22 DIAGNOSIS — M25562 Pain in left knee: Secondary | ICD-10-CM | POA: Insufficient documentation

## 2016-01-22 NOTE — Assessment & Plan Note (Signed)
Discussed with patient. Recent symptoms. No need for imaging at this point. The knee still feels solid. She could've irritated her meniscus. She doesn't have any high risk trauma. At this point ice, and Ace wrap, and nsaids with GI caution are reasonable. Gradual return to exercise. Update us as needed. Discussed with patient. She agrees.

## 2016-02-10 DIAGNOSIS — B373 Candidiasis of vulva and vagina: Secondary | ICD-10-CM | POA: Diagnosis not present

## 2016-02-10 DIAGNOSIS — N898 Other specified noninflammatory disorders of vagina: Secondary | ICD-10-CM | POA: Diagnosis not present

## 2016-02-13 ENCOUNTER — Encounter: Payer: Self-pay | Admitting: Family Medicine

## 2016-02-13 ENCOUNTER — Ambulatory Visit (INDEPENDENT_AMBULATORY_CARE_PROVIDER_SITE_OTHER): Payer: BLUE CROSS/BLUE SHIELD | Admitting: Family Medicine

## 2016-02-13 ENCOUNTER — Ambulatory Visit (INDEPENDENT_AMBULATORY_CARE_PROVIDER_SITE_OTHER)
Admission: RE | Admit: 2016-02-13 | Discharge: 2016-02-13 | Disposition: A | Discharge status: Home / Self Care | Payer: BLUE CROSS/BLUE SHIELD | Source: Ambulatory Visit | Attending: Family Medicine | Admitting: Family Medicine

## 2016-02-13 DIAGNOSIS — S8992XA Unspecified injury of left lower leg, initial encounter: Secondary | ICD-10-CM | POA: Insufficient documentation

## 2016-02-13 DIAGNOSIS — S8992XD Unspecified injury of left lower leg, subsequent encounter: Secondary | ICD-10-CM | POA: Diagnosis not present

## 2016-02-13 DIAGNOSIS — M25562 Pain in left knee: Secondary | ICD-10-CM

## 2016-02-13 NOTE — Progress Notes (Signed)
Pre visit review using our clinic review tool, if applicable. No additional management support is needed unless otherwise documented below in the visit note. 

## 2016-02-13 NOTE — Patient Instructions (Signed)
Great to see you. I will call you with your xray results.  Keep your knee wrapped and elevated as you have been doing.

## 2016-02-13 NOTE — Progress Notes (Signed)
SUBJECTIVE: Robin Fry is a 60 y.o. female who sustained a left knee injury 3 week(s) ago. Mechanism of injury: running. Immediate symptoms: immediate pain. Symptoms have been insidious since that time. Prior history of related problems: previous knee injury skiing accident 20 years ago.  Saw Dr. Para Marchuncan for this complaint on 9/8.  Note reviewed. Felt meniscus was irritated- advised ACE wrap, NSAIDs.  Here today because pain is getting worse.  Feels knee is more swollen.  Pain is worse with walking.  She has been wearing an ACEI as advised.  Current Outpatient Prescriptions on File Prior to Visit  Medication Sig Dispense Refill  . amphetamine-dextroamphetamine (ADDERALL) 5 MG tablet Take 2 by mouth every morning 60 tablet 0  . buPROPion (WELLBUTRIN XL) 150 MG 24 hr tablet Take 150 mg by mouth daily.     No current facility-administered medications on file prior to visit.     Allergies  Allergen Reactions  . Clindamycin/Lincomycin Nausea Only and Hypertension  . Codeine     nightmares  . Penicillins Hives  . Tetracyclines & Related Hives    No past medical history on file.  Past Surgical History:  Procedure Laterality Date  . novasure      Family History  Problem Relation Age of Onset  . Osteoporosis Mother   . Diabetes Mother   . Alcohol abuse Father   . Heart disease Father 7340    first MI at 6440  . Diabetes Father     Social History   Social History  . Marital status: Single    Spouse name: N/A  . Number of children: N/A  . Years of education: N/A   Occupational History  . Not on file.   Social History Main Topics  . Smoking status: Former Games developermoker  . Smokeless tobacco: Never Used  . Alcohol use No  . Drug use: No  . Sexual activity: Not on file   Other Topics Concern  . Not on file   Social History Narrative   Engaged.  Works in [employer].         The PMH, PSH, Social History, Family History, Medications, and allergies [facility], and  have been updated if relevant.  OBJECTIVE: BP 112/68   Pulse 79   Temp 98.3 F (36.8 C) (Oral)   Wt 179 lb 12 oz (81.5 kg)   SpO2 97%   BMI 28.79 kg/m   Vital signs as noted above. Appearance: alert, well appearing, and in no distress. Knee exam: effusion, reduced range of motion, positive McMurray sign. X-ray: ordered, but results not yet available.  ASSESSMENT: Knee tendon injury and internal derangement  PLAN: rest the injured area as much as practical, X-Ray ordered May need ortho referral. The patient indicates understanding of these issues and agrees with the plan.

## 2016-04-23 ENCOUNTER — Other Ambulatory Visit: Payer: Self-pay | Admitting: Family Medicine

## 2016-04-23 DIAGNOSIS — Z01419 Encounter for gynecological examination (general) (routine) without abnormal findings: Secondary | ICD-10-CM

## 2016-04-24 ENCOUNTER — Other Ambulatory Visit (INDEPENDENT_AMBULATORY_CARE_PROVIDER_SITE_OTHER): Payer: BLUE CROSS/BLUE SHIELD

## 2016-04-24 DIAGNOSIS — Z01419 Encounter for gynecological examination (general) (routine) without abnormal findings: Secondary | ICD-10-CM

## 2016-04-24 LAB — COMPREHENSIVE METABOLIC PANEL
ALBUMIN: 4.3 g/dL (ref 3.5–5.2)
ALT: 21 U/L (ref 0–35)
AST: 20 U/L (ref 0–37)
Alkaline Phosphatase: 54 U/L (ref 39–117)
BUN: 10 mg/dL (ref 6–23)
CALCIUM: 9.7 mg/dL (ref 8.4–10.5)
CHLORIDE: 106 meq/L (ref 96–112)
CO2: 28 meq/L (ref 19–32)
CREATININE: 0.75 mg/dL (ref 0.40–1.20)
GFR: 83.95 mL/min (ref >60.00)
Glucose, Bld: 97 mg/dL (ref 70–99)
POTASSIUM: 4.8 meq/L (ref 3.5–5.1)
Sodium: 141 mEq/L (ref 135–145)
Total Bilirubin: 0.8 mg/dL (ref 0.2–1.2)
Total Protein: 6.7 g/dL (ref 6.0–8.3)

## 2016-04-24 LAB — CBC WITH DIFFERENTIAL/PLATELET
BASOS PCT: 0.3 % (ref 0.0–3.0)
Basophils Absolute: 0 10*3/uL (ref 0.0–0.1)
EOS ABS: 0.2 10*3/uL (ref 0.0–0.7)
EOS PCT: 3.1 % (ref 0.0–5.0)
HEMATOCRIT: 39.7 % (ref 36.0–46.0)
HEMOGLOBIN: 13.5 g/dL (ref 12.0–15.0)
LYMPHS PCT: 32.2 % (ref 12.0–46.0)
Lymphs Abs: 1.9 10*3/uL (ref 0.7–4.0)
MCHC: 33.9 g/dL (ref 30.0–36.0)
MCV: 88.5 fl (ref 78.0–100.0)
MONOS PCT: 7.7 % (ref 3.0–12.0)
Monocytes Absolute: 0.4 10*3/uL (ref 0.1–1.0)
NEUTROS ABS: 3.3 10*3/uL (ref 1.4–7.7)
Neutrophils Relative %: 56.7 % (ref 43.0–77.0)
PLATELETS: 257 10*3/uL (ref 150.0–400.0)
RBC: 4.49 Mil/uL (ref 3.87–5.11)
RDW: 12.7 % (ref 11.5–15.5)
WBC: 5.8 10*3/uL (ref 4.0–10.5)

## 2016-04-24 LAB — LIPID PANEL
Cholesterol: 212 mg/dL — ABNORMAL HIGH (ref 0–200)
HDL: 60.4 mg/dL (ref >39.00)
LDL CALC: 135 mg/dL — AB (ref 0–99)
NONHDL: 151.45
Total CHOL/HDL Ratio: 4
Triglycerides: 82 mg/dL (ref 0.0–149.0)
VLDL: 16.4 mg/dL (ref 0.0–40.0)

## 2016-04-24 LAB — TSH: TSH: 1.46 u[IU]/mL (ref 0.35–4.50)

## 2016-05-02 DIAGNOSIS — L700 Acne vulgaris: Secondary | ICD-10-CM | POA: Diagnosis not present

## 2016-05-04 DIAGNOSIS — Z6828 Body mass index (BMI) 28.0-28.9, adult: Secondary | ICD-10-CM | POA: Diagnosis not present

## 2016-05-04 DIAGNOSIS — N898 Other specified noninflammatory disorders of vagina: Secondary | ICD-10-CM | POA: Diagnosis not present

## 2016-05-04 DIAGNOSIS — B373 Candidiasis of vulva and vagina: Secondary | ICD-10-CM | POA: Diagnosis not present

## 2016-05-04 DIAGNOSIS — Z1212 Encounter for screening for malignant neoplasm of rectum: Secondary | ICD-10-CM | POA: Diagnosis not present

## 2016-05-04 DIAGNOSIS — Z01411 Encounter for gynecological examination (general) (routine) with abnormal findings: Secondary | ICD-10-CM | POA: Diagnosis not present

## 2016-05-16 ENCOUNTER — Ambulatory Visit (INDEPENDENT_AMBULATORY_CARE_PROVIDER_SITE_OTHER): Payer: BLUE CROSS/BLUE SHIELD | Admitting: Family Medicine

## 2016-05-16 ENCOUNTER — Encounter: Payer: Self-pay | Admitting: Family Medicine

## 2016-05-16 VITALS — BP 126/80 | HR 70 | Temp 98.3°F | Ht 67.0 in | Wt 181.0 lb

## 2016-05-16 DIAGNOSIS — Z23 Encounter for immunization: Secondary | ICD-10-CM

## 2016-05-16 DIAGNOSIS — Z01419 Encounter for gynecological examination (general) (routine) without abnormal findings: Secondary | ICD-10-CM

## 2016-05-16 NOTE — Progress Notes (Signed)
Subjective:   Patient ID: Robin Fry, female    DOB: 1956-05-09, 61 y.o.   MRN: [id number]  Robin Fry is a pleasant 61 y.o. year old female who presents to clinic today with Annual Exam (Sees GYN. Pap in Dec 2017)  on 05/16/2016  HPI:  Has GYN- pap smear done last month, 04/2016.  Remote h/o ablation. Per pt, mammogram UTD.  Colonoscopy UTD- per pt, done in [place].   Lab Results  Component Value Date   CHOL 212 (H) 04/24/2016   HDL 60.40 04/24/2016   LDLCALC 135 (H) 04/24/2016   TRIG 82.0 04/24/2016   CHOLHDL 4 04/24/2016   Lab Results  Component Value Date   NA 141 04/24/2016   K 4.8 04/24/2016   CL 106 04/24/2016   CO2 28 04/24/2016   Lab Results  Component Value Date   WBC 5.8 04/24/2016   HGB 13.5 04/24/2016   HCT 39.7 04/24/2016   MCV 88.5 04/24/2016   PLT 257.0 04/24/2016   Lab Results  Component Value Date   TSH 1.46 04/24/2016     Current Outpatient Prescriptions on File Prior to Visit  Medication Sig Dispense Refill  . amphetamine-dextroamphetamine (ADDERALL) 5 MG tablet Take 2 by mouth every morning 60 tablet 0  . buPROPion (WELLBUTRIN XL) 150 MG 24 hr tablet Take 150 mg by mouth daily.     No current facility-administered medications on file prior to visit.     Allergies  Allergen Reactions  . Clindamycin/Lincomycin Nausea Only and Hypertension  . Codeine     nightmares  . Penicillins Hives  . Tetracyclines & Related Hives  . Latex Hives    No past medical history on file.  Past Surgical History:  Procedure Laterality Date  . novasure      Family History  Problem Relation Age of Onset  . Osteoporosis Mother   . Diabetes Mother   . Alcohol abuse Father   . Heart disease Father 16    first MI at 79  . Diabetes Father     Social History   Social History  . Marital status: Single    Spouse name: N/A  . Number of children: N/A  . Years of education: N/A   Occupational History  . Not on file.   Social History Main Topics   . Smoking status: Former Games developer  . Smokeless tobacco: Never Used  . Alcohol use No  . Drug use: No  . Sexual activity: Not on file   Other Topics Concern  . Not on file   Social History Narrative   Engaged.  Works in [employer].         The PMH, PSH, Social History, Family History, Medications, and allergies have been reviewed in [facility], and have been updated if relevant.   Review of Systems  Constitutional: Negative.   HENT: Negative.   Eyes: Negative.   Respiratory: Negative.   Cardiovascular: Negative.   Gastrointestinal: Negative.   Genitourinary: Negative.   Musculoskeletal: Negative.   Neurological: Negative.   Hematological: Negative.   Psychiatric/Behavioral: Negative.   All other systems reviewed and are negative.      Objective:    BP 126/80 (BP Location: Left Arm, Patient Position: Sitting, Cuff Size: Normal)   Pulse 70   Temp 98.3 F (36.8 C) (Oral)   Ht 5\' 7"  (1.702 m)   Wt 181 lb (82.1 kg)   SpO2 96%   BMI 28.35 kg/m    Physical Exam   General:  Well-developed,well-nourished,in no acute distress; alert,appropriate and cooperative throughout examination Head:  normocephalic and atraumatic.   Eyes:  vision grossly intact, PERRL Ears:  R ear normal and L ear normal externally, TMs clear bilaterally Nose:  no external deformity.   Mouth:  good dentition.   Neck:  No deformities, masses, or tenderness noted. Lungs:  Normal respiratory effort, chest expands symmetrically. Lungs are clear to auscultation, no crackles or wheezes. Heart:  Normal rate and regular rhythm. S1 and S2 normal without gallop, murmur, click, rub or other extra sounds. Abdomen:  Bowel sounds positive,abdomen soft and non-tender without masses, organomegaly or hernias noted. Msk:  No deformity or scoliosis noted of thoracic or lumbar spine.   Extremities:  No clubbing, cyanosis, edema, or deformity noted with normal full range of motion of all joints.   Neurologic:  alert &  oriented X3 and gait normal.   Skin:  Intact without suspicious lesions or rashes Cervical Nodes:  No lymphadenopathy noted Axillary Nodes:  No palpable lymphadenopathy Psych:  Cognition and judgment appear intact. Alert and cooperative with normal attention span and concentration. No apparent delusions, illusions, hallucinations       Assessment & Plan:   Well woman exam No Follow-up on file.

## 2016-05-16 NOTE — Addendum Note (Signed)
Addended by: Eual FinesBRIDGES, Aditi Rovira P on: 05/16/2016 08:48 AM   Modules accepted: Orders

## 2016-05-16 NOTE — Patient Instructions (Signed)
Great to see you.  Happy New Year!  [email or web address]

## 2016-05-16 NOTE — Assessment & Plan Note (Signed)
Reviewed preventive care protocols, scheduled due services, and updated immunizations Discussed nutrition, exercise, diet, and healthy lifestyle.  

## 2016-05-16 NOTE — Progress Notes (Signed)
Pre visit review using our clinic review tool, if applicable. No additional management support is needed unless otherwise documented below in the visit note. 

## 2016-06-15 ENCOUNTER — Ambulatory Visit (INDEPENDENT_AMBULATORY_CARE_PROVIDER_SITE_OTHER): Payer: BLUE CROSS/BLUE SHIELD

## 2016-06-15 DIAGNOSIS — Z23 Encounter for immunization: Secondary | ICD-10-CM | POA: Diagnosis not present

## 2016-09-06 DIAGNOSIS — G8929 Other chronic pain: Secondary | ICD-10-CM | POA: Diagnosis not present

## 2016-09-06 DIAGNOSIS — M25562 Pain in left knee: Secondary | ICD-10-CM | POA: Diagnosis not present

## 2016-09-06 DIAGNOSIS — M222X2 Patellofemoral disorders, left knee: Secondary | ICD-10-CM | POA: Diagnosis not present

## 2016-09-12 DIAGNOSIS — M25562 Pain in left knee: Secondary | ICD-10-CM | POA: Diagnosis not present

## 2016-09-14 DIAGNOSIS — M25562 Pain in left knee: Secondary | ICD-10-CM | POA: Diagnosis not present

## 2016-09-17 DIAGNOSIS — M25562 Pain in left knee: Secondary | ICD-10-CM | POA: Diagnosis not present

## 2016-09-21 DIAGNOSIS — M25562 Pain in left knee: Secondary | ICD-10-CM | POA: Diagnosis not present

## 2016-09-26 DIAGNOSIS — M25562 Pain in left knee: Secondary | ICD-10-CM | POA: Diagnosis not present

## 2016-10-02 DIAGNOSIS — M25562 Pain in left knee: Secondary | ICD-10-CM | POA: Diagnosis not present

## 2016-10-05 DIAGNOSIS — M25562 Pain in left knee: Secondary | ICD-10-CM | POA: Diagnosis not present

## 2016-10-09 DIAGNOSIS — M25562 Pain in left knee: Secondary | ICD-10-CM | POA: Diagnosis not present

## 2016-10-11 DIAGNOSIS — M25562 Pain in left knee: Secondary | ICD-10-CM | POA: Diagnosis not present

## 2016-10-30 ENCOUNTER — Telehealth: Payer: Self-pay | Admitting: Family Medicine

## 2016-10-30 ENCOUNTER — Ambulatory Visit (INDEPENDENT_AMBULATORY_CARE_PROVIDER_SITE_OTHER): Payer: BLUE CROSS/BLUE SHIELD | Admitting: Family Medicine

## 2016-10-30 ENCOUNTER — Encounter: Payer: Self-pay | Admitting: Family Medicine

## 2016-10-30 VITALS — BP 126/80 | HR 77 | Temp 98.3°F | Ht 67.0 in | Wt 177.0 lb

## 2016-10-30 DIAGNOSIS — J069 Acute upper respiratory infection, unspecified: Secondary | ICD-10-CM

## 2016-10-30 MED ORDER — AZITHROMYCIN 250 MG PO TABS: ORAL_TABLET | ORAL | 0 refills | Status: DC

## 2016-10-30 MED ORDER — PROMETHAZINE-DM 6.25-15 MG/5ML PO SYRP: 5.0000 mL | ORAL_SOLUTION | Freq: Four times a day (QID) | ORAL | 0 refills | Status: DC | PRN

## 2016-10-30 NOTE — Patient Instructions (Signed)
Great to see you.  Take zpack as directed, promethazine DM as needed for severe cough.

## 2016-10-30 NOTE — Telephone Encounter (Signed)
Pt dropped off copy of colonoscopy results. Placed on cart.

## 2016-10-30 NOTE — Progress Notes (Signed)
Pre visit review using our clinic review tool, if applicable. No additional management support is needed unless otherwise documented below in the visit note. 

## 2016-10-30 NOTE — Progress Notes (Signed)
SUBJECTIVE:  Robin Fry is a 61 y.o. female who complains of coryza, congestion, sneezing, sore throat, productive cough, both sides sinus pain, chills and hoarseness for 12 days. She denies a history of anorexia and chest pain and denies a history of asthma. Patient denies smoke cigarettes.   Current Outpatient Prescriptions on File Prior to Visit  Medication Sig Dispense Refill  . amphetamine-dextroamphetamine (ADDERALL) 5 MG tablet Take 2 by mouth every morning 60 tablet 0  . buPROPion (WELLBUTRIN XL) 150 MG 24 hr tablet Take 150 mg by mouth daily.     No current facility-administered medications on file prior to visit.     Allergies  Allergen Reactions  . Clindamycin/Lincomycin Nausea Only and Hypertension  . Codeine     nightmares  . Penicillins Hives  . Tetracyclines & Related Hives  . Latex Hives    No past medical history on file.  Past Surgical History:  Procedure Laterality Date  . novasure      Family History  Problem Relation Age of Onset  . Osteoporosis Mother   . Diabetes Mother   . Alcohol abuse Father   . Heart disease Father 6340       first MI at 4440  . Diabetes Father     Social History   Social History  . Marital status: Single    Spouse name: N/A  . Number of children: N/A  . Years of education: N/A   Occupational History  . Not on file.   Social History Main Topics  . Smoking status: Former Games developermoker  . Smokeless tobacco: Never Used  . Alcohol use No  . Drug use: No  . Sexual activity: Not on file   Other Topics Concern  . Not on file   Social History Narrative   Engaged.  Works in [employer].         The PMH, PSH, Social History, Family History, Medications, and allergies [facility] been [facility], and have been updated if relevant.  OBJECTIVE:   BP 126/80   Pulse 77   Temp 98.3 F (36.8 C)   Ht 5\' 7"  (1.702 m)   Wt 177 lb (80.3 kg)   SpO2 97%   BMI 27.72 kg/m   She appears well, vital signs are as noted. Ears normal.   Throat and pharynx normal.  Neck supple. No adenopathy in the neck. Nose is congested. Sinuses  tender. The chest is clear, without wheezes or rales.  ASSESSMENT:  sinusitis  PLAN: Given duration and progression of symptoms, will treat for bacterial sinusitis with zpack (multiple abx allergies), promethazine DM for cough.  Symptomatic therapy suggested: push fluids, rest and return office visit prn if symptoms persist or worsen.Call or return to clinic prn if these symptoms worsen or fail to improve as anticipated.

## 2016-11-06 ENCOUNTER — Ambulatory Visit (INDEPENDENT_AMBULATORY_CARE_PROVIDER_SITE_OTHER): Payer: BLUE CROSS/BLUE SHIELD | Admitting: Internal Medicine

## 2016-11-06 ENCOUNTER — Encounter: Payer: Self-pay | Admitting: Internal Medicine

## 2016-11-06 VITALS — BP 122/84 | HR 72 | Temp 98.0°F | Wt 178.0 lb

## 2016-11-06 DIAGNOSIS — J329 Chronic sinusitis, unspecified: Secondary | ICD-10-CM

## 2016-11-06 DIAGNOSIS — B9789 Other viral agents as the cause of diseases classified elsewhere: Secondary | ICD-10-CM | POA: Diagnosis not present

## 2016-11-06 NOTE — Progress Notes (Signed)
HPI  Pt presents to the clinic today for follow up for URI. She was seen 6/19 by Dr. Dayton Martes for the same- note reviewed. She was started having symptoms 2.5 weeks ago. She was treated with Azithromycin and Promethazine DM. She has taken all the medication as prescribed. She reports ongoing facial pain and pressure, ear fullness, nasal congestion and cough. She reports her ear are popping, but she denies pain or decreased hearing. She is not able to blow anything out of her nose. The cough is non productive. She denies fever, chills or body aches. She has tried Cepacol and Nyquil with minimal relief. She does have a history of seasonal allergies. She has not had sick contacts that she is aware of.  Review of Systems       No past medical history on file.  Family History  Problem Relation Age of Onset  . Osteoporosis Mother   . Diabetes Mother   . Alcohol abuse Father   . Heart disease Father 82       first MI at 45  . Diabetes Father     Social History   Social History  . Marital status: Single    Spouse name: N/A  . Number of children: N/A  . Years of education: N/A   Occupational History  . Not on file.   Social History Main Topics  . Smoking status: Former Games developer  . Smokeless tobacco: Never Used  . Alcohol use No  . Drug use: No  . Sexual activity: Not on file   Other Topics Concern  . Not on file   Social History Narrative   Engaged.  Works in [employer].          Allergies  Allergen Reactions  . Clindamycin/Lincomycin Nausea Only and Hypertension  . Codeine     nightmares  . Penicillins Hives  . Tetracyclines & Related Hives  . Latex Hives     Constitutional: Pt reports headache. Denies fatigue, fever or abrupt weight changes.  HEENT:  Positive ear fullness, facial pain and pressure, and nasal congestion. Denies eye redness, eye pain, pressure behind the eyes, ear pain, ringing in the ears, wax buildup, runny nose or bloody nose. Respiratory: Positive cough.  Denies difficulty breathing or shortness of breath.  Cardiovascular: Denies chest pain, chest tightness, palpitations or swelling in the hands or feet.   No other specific complaints in a complete review of systems (except as listed in HPI above).  Objective:   BP 122/84   Pulse 72   Temp 98 F (36.7 C) (Oral)   Wt 178 lb (80.7 kg)   SpO2 97%   BMI 27.88 kg/m   Wt Readings from Last 3 Encounters:  10/30/16 177 lb (80.3 kg)  05/16/16 181 lb (82.1 kg)  02/13/16 179 lb 12 oz (81.5 kg)     General: Appears her stated age, in NAD. HEENT: Head: normal shape and size, mild maxillary sinus tenderness noted; Eyes: sclera white, no icterus, conjunctiva pink; Ears: Tm's gray and intact, normal light reflex; Nose: mucosa pink and moist, turbinates swollen; Throat/Mouth: + PND. Teeth present, mucosa pink and moist, no exudate noted, no lesions or ulcerations noted.  Neck: No cervical lymphadenopathy.  Pulmonary/Chest: Normal effort and positive vesicular breath sounds. No respiratory distress. No wheezes, rales or ronchi noted.       Assessment & Plan:   Viral Sinusitis:  Get some rest and drink plenty of water No indication for additional abx Start Zyrtec and Flonase  OTC Continue Promethazine DM cough syrup  RTC as needed or if symptoms persist.   Nicki ReaperBAITY, REGINA, NP

## 2016-11-06 NOTE — Patient Instructions (Signed)

## 2016-11-09 DIAGNOSIS — M25562 Pain in left knee: Secondary | ICD-10-CM | POA: Diagnosis not present

## 2016-11-20 DIAGNOSIS — M25562 Pain in left knee: Secondary | ICD-10-CM | POA: Diagnosis not present

## 2016-11-29 DIAGNOSIS — M25562 Pain in left knee: Secondary | ICD-10-CM | POA: Diagnosis not present

## 2016-11-30 ENCOUNTER — Encounter: Payer: Self-pay | Admitting: Primary Care

## 2016-11-30 ENCOUNTER — Ambulatory Visit (INDEPENDENT_AMBULATORY_CARE_PROVIDER_SITE_OTHER): Payer: BLUE CROSS/BLUE SHIELD | Admitting: Primary Care

## 2016-11-30 VITALS — BP 136/86 | HR 75 | Temp 98.0°F | Ht 67.0 in | Wt 178.1 lb

## 2016-11-30 DIAGNOSIS — L989 Disorder of the skin and subcutaneous tissue, unspecified: Secondary | ICD-10-CM | POA: Diagnosis not present

## 2016-11-30 NOTE — Progress Notes (Signed)
   Subjective:    Patient ID: Robin Fry, female    DOB: Sep 06, 1955, 61 y.o.   MRN: [id number]  HPI  Robin Fry is a 61 year old female who presents today with a chief complaint of skin spots.   She's noticed a spot to her right posterior forearm that she first noticed 2 weeks ago. The spot hasn't changed size, color, shape. She gardens and has been outdoors frequently. She also has a discomfort to the left plantar foot that she noticed several weeks ago. The spot will become tender at times. She denies itching, pain, injury.    Review of Systems  Constitutional: Negative for fever.  Musculoskeletal: Negative for arthralgias.  Skin: Positive for color change. Negative for rash and wound.       No past medical history on file.   Social History   Social History  . Marital status: Single    Spouse name: N/A  . Number of children: N/A  . Years of education: N/A   Occupational History  . Not on file.   Social History Main Topics  . Smoking status: Former Games developermoker  . Smokeless tobacco: Never Used  . Alcohol use No  . Drug use: No  . Sexual activity: Not on file   Other Topics Concern  . Not on file   Social History Narrative   Engaged.  Works in [employer].          Past Surgical History:  Procedure Laterality Date  . novasure      Family History  Problem Relation Age of Onset  . Osteoporosis Mother   . Diabetes Mother   . Alcohol abuse Father   . Heart disease Father 5940       first MI at 2740  . Diabetes Father     Allergies  Allergen Reactions  . Clindamycin/Lincomycin Nausea Only and Hypertension  . Codeine     nightmares  . Penicillins Hives  . Tetracyclines & Related Hives  . Latex Hives    Current Outpatient Prescriptions on File Prior to Visit  Medication Sig Dispense Refill  . amphetamine-dextroamphetamine (ADDERALL) 5 MG tablet Take 2 by mouth every morning 60 tablet 0  . buPROPion (WELLBUTRIN XL) 150 MG 24 hr tablet Take 150 mg by mouth daily.      No current facility-administered medications on file prior to visit.     BP 136/86   Pulse 75   Temp 98 F (36.7 C) (Oral)   Ht 5\' 7"  (1.702 m)   Wt 178 lb 1.9 oz (80.8 kg)   SpO2 98%   BMI 27.90 kg/m    Objective:   Physical Exam  Constitutional: She appears well-nourished.  Neck: Neck supple.  Cardiovascular: Normal rate.   Pulmonary/Chest: Effort normal.  Skin: Skin is warm and dry.  1-2 mm circular mildly red, flat spot to right posterior forearm. Several other mild abrasions to bilateral forearms.  Small dark spot to left plantar foot.          Assessment & Plan:  Skin Spots:  Arm: Appears benign. Round, regular borders, color consistent. Discussed to wear sunscreen when outdoors. Suspect this is a small abrasion when gardening/outdoors. Will have her monitor. Provided warning signs of skin cancer.  Foot: Appears to be superficial, small plantar wart. Discussed OTC treatment.  Information provided.  Morrie Sheldonlark,Pelham Hennick Kendal, NP

## 2016-11-30 NOTE — Patient Instructions (Signed)
Monitor the spot on your arm and notify us if the spot changes size, shape, color as discussed. Continue to wear sunscreen.  The spot on your foot is likely a plantar wart. Take a look at the Wart remover kits over the counter.  It was a pleasure meeting you!   Plantar Warts Warts are small growths on the skin. They can occur on various areas of the body. When they occur on the underside (sole) of the foot, they are called plantar warts. Plantar warts often occur in groups, with several small warts around a larger growth. They tend to develop over areas of pressure, such as the heel or the ball of the foot. Most warts are not painful, and they usually do not cause problems. However, plantar warts may cause pain when you walk because pressure is applied to them. Warts often go away on their own in time. Various treatments may be done if needed. Sometimes, warts go away and then they come back again. What are the causes? Plantar warts are caused by a type of virus that is called human papillomavirus (HPV). HPV attacks a break in the skin of the foot. Walking barefoot can lead to exposure to the virus. These warts may spread to other areas of the sole. They spread to other areas of the body only through direct contact. What increases the risk? Plantar warts are more likely to develop in:  People who are 6610-60 years of age.  People who use public showers or locker rooms.  People who have a weakened body defense system (immune system).  What are the signs or symptoms? Plantar warts may be flat or slightly raised. They may grow into the deeper layers of skin or rise above the surface of the skin. Most plantar warts have a rough surface. They may cause pain when you use your foot to support your body weight. How is this diagnosed? A plantar wart can usually be diagnosed from its appearance. In some cases, a tissue sample may be removed (biopsy) to be looked at under a microscope. How is this  treated? In many cases, warts do not need treatment. Without treatment, they often go away over a period of many months to a couple years. If treatment is needed, options may include:  Applying medicated solutions, creams, or patches to the wart. These may be over-the-counter or prescription medicines that make the skin soft so that layers will gradually shed away. In many cases, the medicine is applied one or two times per day and covered with a bandage.  Putting duct tape over the top of the wart (occlusion). You will leave the tape in place for as long as told by your health care provider, then you will replace it with a new strip of tape. This is done until the wart goes away.  Freezing the wart with liquid nitrogen (cryotherapy).  Burning the wart with: ? Laser treatment. ? An electrified probe (electrocautery).  Injection of a medicine (Candida antigen) into the wart to help the body's immune system to fight off the wart.  Surgery to remove the wart.  Follow these instructions at home:  Apply medicated creams or solutions only as told by your health care provider. This may involve: ? Soaking the affected area in warm water. ? Removing the top layer of softened skin before you apply the medicine. A pumice stone works well for removing the tissue. ? Applying a bandage over the affected area after you apply the medicine. ?  Repeating the process daily or as told by your health care provider.  Do not scratch or pick at a wart.  Wash your hands after you touch a wart.  If a wart is painful, try applying a bandage with a hole in the middle over the wart. The helps to take pressure off the wart.  Keep all follow-up visits as told by your health care provider. This is important. How is this prevented? Take these actions to help prevent warts:  Wear shoes and socks. Change your socks daily.  Keep your feet clean and dry.  Check your feet regularly.  Avoid direct contact with  warts on other people.  Contact a health care provider if:  Your warts do not improve after treatment.  You have redness, swelling, or pain at the site of a wart.  You have bleeding from a wart that does not stop with light pressure.  You have diabetes and you develop a wart. This information is not intended to replace advice given to you by your health care provider. Make sure you discuss any questions you have with your health care provider. Document Released: 07/21/2003 Document Revised: 10/06/2015 Document Reviewed: 07/26/2014 Elsevier Interactive Patient Education  [employer].

## 2016-12-06 DIAGNOSIS — M25562 Pain in left knee: Secondary | ICD-10-CM | POA: Diagnosis not present

## 2016-12-20 DIAGNOSIS — M25562 Pain in left knee: Secondary | ICD-10-CM | POA: Diagnosis not present

## 2017-01-23 ENCOUNTER — Ambulatory Visit (INDEPENDENT_AMBULATORY_CARE_PROVIDER_SITE_OTHER): Payer: BLUE CROSS/BLUE SHIELD | Admitting: Family Medicine

## 2017-01-23 ENCOUNTER — Encounter: Payer: Self-pay | Admitting: Family Medicine

## 2017-01-23 DIAGNOSIS — M79602 Pain in left arm: Secondary | ICD-10-CM | POA: Diagnosis not present

## 2017-01-23 NOTE — Progress Notes (Signed)
Subjective:   Patient ID: Robin Fry, female    DOB: May 19, 1955, 61 y.o.   MRN: [id number]  Robin Fry is a pleasant 61 y.o. year old female who presents to clinic today with Arm Pain (left arm, several weeks )  on 01/23/2017  HPI:  Several weeks of left arm pain- tender to touch, feels it is progressing.  At one ponit felt warm. Now starting to feel similar symptoms on the right arm.  She did travel to [place] but that was several weeks before this started.  Has never had anything like this before.  Almost feels like a bruise. Does not appear swollen.  Has not changed anything- no new exercise or lifting something. No new bed or pillow.  Nothing seems to make it better, touching it makes it worse.   Current Outpatient Prescriptions on File Prior to Visit  Medication Sig Dispense Refill  . amphetamine-dextroamphetamine (ADDERALL) 5 MG tablet Take 2 by mouth every morning 60 tablet 0   No current facility-administered medications on file prior to visit.     Allergies  Allergen Reactions  . Clindamycin/Lincomycin Nausea Only and Hypertension  . Codeine     nightmares  . Penicillins Hives  . Tetracyclines & Related Hives  . Latex Hives    History reviewed. No pertinent past medical history.  Past Surgical History:  Procedure Laterality Date  . novasure      Family History  Problem Relation Age of Onset  . Osteoporosis Mother   . Diabetes Mother   . Alcohol abuse Father   . Heart disease Father 32       first MI at 57  . Diabetes Father     Social History   Social History  . Marital status: Single    Spouse name: N/A  . Number of children: N/A  . Years of education: N/A   Occupational History  . Not on file.   Social History Main Topics  . Smoking status: Former Games developer  . Smokeless tobacco: Never Used  . Alcohol use No  . Drug use: No  . Sexual activity: Not on file   Other Topics Concern  . Not on file   Social History Narrative   Engaged.   Works in [employer].         The PMH, PSH, Social History, Family History, Medications, and allergies have been reviewed in [facility], and have been updated if relevant.   Review of Systems  Constitutional: Negative.   Respiratory: Negative.   Cardiovascular: Negative.   Musculoskeletal: Positive for myalgias.  Skin: Negative.   Hematological: Negative.   All other systems reviewed and are negative.      Objective:    BP 108/80   Pulse 73   Temp 98.6 F (37 C) (Oral)   Resp 17   Wt 181 lb 6.4 oz (82.3 kg)   SpO2 97%   BMI 28.41 kg/m    Physical Exam  Constitutional: She is oriented to person, place, and time. She appears well-developed and well-nourished. No distress.  HENT:  Head: Normocephalic and atraumatic.  Eyes: Conjunctivae are normal.  Cardiovascular: Normal rate.   Pulmonary/Chest: Effort normal.  Musculoskeletal:       Left forearm: She exhibits tenderness. She exhibits no bony tenderness, no swelling, no edema, no deformity and no laceration.  Neurological: She is alert and oriented to person, place, and time. No cranial nerve deficit.  Skin: She is not diaphoretic.  Psychiatric: She has a normal mood  and affect. Her behavior is normal. Judgment and thought content normal.  Nursing note and vitals reviewed.         Assessment & Plan:   Left arm pain - Plan: DOPPLER VENOUS ARMS BILATERAL No Follow-up on file.

## 2017-01-23 NOTE — Assessment & Plan Note (Signed)
New- progressing. No abnormality noted on exam other than tenderness to palpation. Will order venous dopplers to rule out blood clots given travel and duration of symptoms. Unclear etiology at this point. The patient indicates understanding of these issues and agrees with the plan.

## 2017-01-23 NOTE — Patient Instructions (Signed)
Great to see you. Please stop by to see Marion on your way out.   

## 2017-01-24 ENCOUNTER — Ambulatory Visit (HOSPITAL_COMMUNITY)
Admission: RE | Admit: 2017-01-24 | Discharge: 2017-01-24 | Disposition: A | Discharge status: Home / Self Care | Payer: BLUE CROSS/BLUE SHIELD | Source: Ambulatory Visit | Attending: Family Medicine | Admitting: Family Medicine

## 2017-01-24 DIAGNOSIS — M79602 Pain in left arm: Secondary | ICD-10-CM | POA: Diagnosis not present

## 2017-01-24 NOTE — Progress Notes (Signed)
*  PRELIMINARY RESULTS* Vascular Ultrasound Upper extremity venous duplex has been completed.  Preliminary findings: No evidence of DVT or superficial thrombosis.  [facility].    Farrel DemarkJill Eunice, RDMS, RVT  01/24/2017, 2:18 PM

## 2017-01-24 NOTE — Addendum Note (Signed)
Addended by: Dianne DunARON, TALIA M on: 01/24/2017 10:37 AM   Modules accepted: Orders

## 2017-01-25 ENCOUNTER — Telehealth: Payer: Self-pay | Admitting: Family Medicine

## 2017-01-25 DIAGNOSIS — M791 Myalgia, unspecified site: Secondary | ICD-10-CM

## 2017-01-25 NOTE — Telephone Encounter (Signed)
Left detailed message letting patient of results.  Pt to call us back with an update of her symptoms.

## 2017-01-25 NOTE — Telephone Encounter (Signed)
Please let pt know that her ultrasounds were negative for DVTs/blood clots- how is her pain today?

## 2017-01-25 NOTE — Telephone Encounter (Signed)
PT returned call, she understood msg. She said her symptoms are the same, about 7-8 on pain scale. Please advise.

## 2017-01-25 NOTE — Telephone Encounter (Signed)
I'd like for her to come for lab work next week to look for inflammatory/rheum issues now that we know it's not a blood clot.  Is she taking any Ibuprofen? I would recommend 800 mg three times daily with food.  I will place lab orders.

## 2017-01-25 NOTE — Telephone Encounter (Signed)
Received a message from Robin Fry from the [facility] Vascular lab. Patient is negative for DVT in both arms. Please call patient directly.

## 2017-02-07 NOTE — Telephone Encounter (Signed)
Patient called.  I let patient know Dr.Aron's comments.  Patient scheduled lab work on 02/08/17.

## 2017-02-08 ENCOUNTER — Other Ambulatory Visit (INDEPENDENT_AMBULATORY_CARE_PROVIDER_SITE_OTHER): Payer: BLUE CROSS/BLUE SHIELD

## 2017-02-08 DIAGNOSIS — M791 Myalgia, unspecified site: Secondary | ICD-10-CM

## 2017-02-11 LAB — SEDIMENTATION RATE: SED RATE: 6 mm/h (ref 0–30)

## 2017-02-11 LAB — HIGH SENSITIVITY CRP: HS-CRP: 1.5 mg/L

## 2017-02-11 LAB — RHEUMATOID FACTOR: Rhuematoid fact SerPl-aCnc: <14 IU/mL (ref <14)

## 2017-03-27 DIAGNOSIS — J0141 Acute recurrent pansinusitis: Secondary | ICD-10-CM | POA: Diagnosis not present

## 2017-05-01 DIAGNOSIS — L820 Inflamed seborrheic keratosis: Secondary | ICD-10-CM | POA: Diagnosis not present

## 2017-05-01 DIAGNOSIS — D225 Melanocytic nevi of trunk: Secondary | ICD-10-CM | POA: Diagnosis not present

## 2017-05-01 DIAGNOSIS — D485 Neoplasm of uncertain behavior of skin: Secondary | ICD-10-CM | POA: Diagnosis not present

## 2017-05-08 DIAGNOSIS — B373 Candidiasis of vulva and vagina: Secondary | ICD-10-CM | POA: Diagnosis not present

## 2017-05-08 DIAGNOSIS — Z01419 Encounter for gynecological examination (general) (routine) without abnormal findings: Secondary | ICD-10-CM | POA: Diagnosis not present

## 2017-06-25 DIAGNOSIS — R140 Abdominal distension (gaseous): Secondary | ICD-10-CM | POA: Diagnosis not present

## 2017-06-25 DIAGNOSIS — N898 Other specified noninflammatory disorders of vagina: Secondary | ICD-10-CM | POA: Diagnosis not present

## 2017-06-25 DIAGNOSIS — N760 Acute vaginitis: Secondary | ICD-10-CM | POA: Diagnosis not present

## 2017-06-25 DIAGNOSIS — R102 Pelvic and perineal pain: Secondary | ICD-10-CM | POA: Diagnosis not present

## 2017-07-03 DIAGNOSIS — L309 Dermatitis, unspecified: Secondary | ICD-10-CM | POA: Diagnosis not present

## 2017-07-18 DIAGNOSIS — D250 Submucous leiomyoma of uterus: Secondary | ICD-10-CM | POA: Diagnosis not present

## 2017-07-18 DIAGNOSIS — D251 Intramural leiomyoma of uterus: Secondary | ICD-10-CM | POA: Diagnosis not present

## 2017-07-18 DIAGNOSIS — R102 Pelvic and perineal pain: Secondary | ICD-10-CM | POA: Diagnosis not present

## 2017-07-18 DIAGNOSIS — R140 Abdominal distension (gaseous): Secondary | ICD-10-CM | POA: Diagnosis not present

## 2017-11-11 ENCOUNTER — Encounter: Payer: Self-pay | Admitting: Family Medicine

## 2017-11-11 ENCOUNTER — Ambulatory Visit (INDEPENDENT_AMBULATORY_CARE_PROVIDER_SITE_OTHER): Payer: BLUE CROSS/BLUE SHIELD | Admitting: Family Medicine

## 2017-11-11 DIAGNOSIS — G44209 Tension-type headache, unspecified, not intractable: Secondary | ICD-10-CM | POA: Diagnosis not present

## 2017-11-11 DIAGNOSIS — R51 Headache: Secondary | ICD-10-CM

## 2017-11-11 DIAGNOSIS — R519 Headache, unspecified: Secondary | ICD-10-CM | POA: Insufficient documentation

## 2017-11-11 MED ORDER — CYCLOBENZAPRINE HCL 10 MG PO TABS: 5.0000 mg | ORAL_TABLET | Freq: Three times a day (TID) | ORAL | 0 refills | Status: DC | PRN

## 2017-11-11 NOTE — Assessment & Plan Note (Addendum)
Headache seems to be tension type headache Neuro exam reassuring Recommend ibuprofen, will add on flexeril She is seeing chiropractor as well, she will keep this up and see her massage therapist as well.  She will let me know if symptoms are not improving or worsening.

## 2017-11-11 NOTE — Patient Instructions (Signed)

## 2017-11-11 NOTE — Progress Notes (Signed)
Robin Fry - 62 y.o. female MRN [id number]  Date of birth: 02/10/1956  Subjective Chief Complaint  Patient presents with  . Headache    feels faint/weak and headache started about a week ago,    HPI Robin Fry is a 62 y.o. female here today with complaint of headache.  Reports that symptoms started about 4-5 days ago.  Pain is dull, located on right side of scalp.  Scalp is mildly sensitive to touch.  She has also felt that her neck has been tight with some tingling sensation in bilateral arms.  She denies weakness in her arms, changes to speech or vision, difficulty swallowing, nausea, light sensitivity.  She does report some dizziness but states that she has had this for years off and on and typically improves with epley maneuver.  She denies feeling of lightheadedness.   ROS:  ROS completed and negative except as noted per HPI  Allergies  Allergen Reactions  . Clindamycin/Lincomycin Nausea Only and Hypertension  . Codeine     nightmares  . Penicillins Hives  . Tetracyclines & Related Hives  . Latex Hives    No past medical history on file.  Past Surgical History:  Procedure Laterality Date  . novasure      Social History   Socioeconomic History  . Marital status: Single    Spouse name: Not on file  . Number of children: Not on file  . Years of education: Not on file  . Highest education level: Not on file  Occupational History  . Not on file  Social Needs  . Financial resource strain: Not on file  . Food insecurity:    Worry: Not on file    Inability: Not on file  . Transportation needs:    Medical: Not on file    Non-medical: Not on file  Tobacco Use  . Smoking status: Former Games developer  . Smokeless tobacco: Never Used  Substance and Sexual Activity  . Alcohol use: No    Alcohol/week: 0.0 oz  . Drug use: No  . Sexual activity: Not on file  Lifestyle  . Physical activity:    Days per week: Not on file    Minutes per session: Not on file  . Stress: Not  on file  Relationships  . Social connections:    Talks on phone: Not on file    Gets together: Not on file    Attends religious service: Not on file    Active member of club or organization: Not on file    Attends meetings of clubs or organizations: Not on file    Relationship status: Not on file  Other Topics Concern  . Not on file  Social History Narrative   Engaged.  Works in [employer].          Family History  Problem Relation Age of Onset  . Osteoporosis Mother   . Diabetes Mother   . Alcohol abuse Father   . Heart disease Father 23       first MI at 57  . Diabetes Father     Health Maintenance  Topic Date Due  . Hepatitis C Screening  10[birth year]-1956  . HIV Screening  11/29/1971  . INFLUENZA VACCINE  12/12/2017  . MAMMOGRAM  01/03/2018  . PAP SMEAR  04/27/2019  . COLONOSCOPY  02/12/2021  . TETANUS/TDAP  05/16/2026    ----------------------------------------------------------------------------------------------------------------------------------------------------------------------------------------------------------------- Physical Exam BP 124/84 (BP Location: Left Arm, Patient Position: Sitting, Cuff Size: Normal)   Pulse 62  Temp 97.9 F (36.6 C) (Oral)   Ht 5\' 7"  (1.702 m)   Wt 183 lb (83 kg)   SpO2 97%   BMI 28.66 kg/m   Physical Exam  Constitutional: She is oriented to person, place, and time. She appears well-nourished. She does not appear ill.  HENT:  Head: Normocephalic and atraumatic.  Mouth/Throat: Oropharynx is clear and moist.  Scalp ttp on right side, no rash noted.   Eyes: Pupils are equal, round, and reactive to light. EOM are normal.  Neck: Normal range of motion. Neck supple. No neck rigidity.  Tightness in upper trapezius bilaterally.    Cardiovascular: Normal rate, regular rhythm, normal heart sounds and intact distal pulses.  Pulmonary/Chest: Effort normal and breath sounds normal.  Musculoskeletal: She exhibits no edema.    Neurological: She is alert and oriented to person, place, and time. She has normal strength. She is not disoriented. No cranial nerve deficit or sensory deficit. She displays a negative Romberg sign. Coordination and gait normal.  Psychiatric: She has a normal mood and affect. Her behavior is normal.    ------------------------------------------------------------------------------------------------------------------------------------------------------------------------------------------------------------------- Assessment and Plan  Headache Headache seems to be tension type headache Neuro exam reassuring Recommend ibuprofen, will add on flexeril She is seeing chiropractor as well, she will keep this up and see her massage therapist as well.  She will let me know if symptoms are not improving or worsening.

## 2018-02-18 ENCOUNTER — Ambulatory Visit (INDEPENDENT_AMBULATORY_CARE_PROVIDER_SITE_OTHER): Payer: BLUE CROSS/BLUE SHIELD | Admitting: Family Medicine

## 2018-02-18 ENCOUNTER — Encounter: Payer: Self-pay | Admitting: Family Medicine

## 2018-02-18 ENCOUNTER — Ambulatory Visit (INDEPENDENT_AMBULATORY_CARE_PROVIDER_SITE_OTHER): Payer: BLUE CROSS/BLUE SHIELD

## 2018-02-18 ENCOUNTER — Ambulatory Visit: Payer: Self-pay

## 2018-02-18 VITALS — BP 146/86 | HR 76 | Temp 98.3°F | Ht 67.0 in | Wt 185.6 lb

## 2018-02-18 DIAGNOSIS — M542 Cervicalgia: Secondary | ICD-10-CM | POA: Diagnosis not present

## 2018-02-18 DIAGNOSIS — M5412 Radiculopathy, cervical region: Secondary | ICD-10-CM

## 2018-02-18 MED ORDER — CYCLOBENZAPRINE HCL 10 MG PO TABS: 5.0000 mg | ORAL_TABLET | Freq: Three times a day (TID) | ORAL | 0 refills | Status: DC | PRN

## 2018-02-18 NOTE — Progress Notes (Signed)
Subjective:   Patient ID: Jesusita Jocelyn, female    DOB: 1956-01-26, 62 y.o.   MRN: [id number]  Zeyna Mkrtchyan is a pleasant 62 y.o. year old female who presents to clinic today with Back Pain (Patient is here today C/O intermittent back pain that is between her shoulder blades.  pain and tightness is at a 4.  Has numbness in the back of her harms bliaterally.)  on 02/18/2018  HPI:  Upper back/neck pain- intermittent back pain in her neck and between her shoulder blades- 4/10 at it's worst.  Has some numbness in back of her arms bilaterally. Under a lot of stress at work.  Looks down at her computer for hours.  Chiropractor has been helping some.   Current Outpatient Medications on File Prior to Visit  Medication Sig Dispense Refill  . amphetamine-dextroamphetamine (ADDERALL) 5 MG tablet Take 2 by mouth every morning 60 tablet 0  . bimatoprost (LATISSE) 0.03 % ophthalmic solution APPLY 1 APPLICATION TO EYELIDS ONCE DAILY  6  . cyclobenzaprine (FLEXERIL) 10 MG tablet Take 0.5-1 tablets (5-10 mg total) by mouth 3 (three) times daily as needed for muscle spasms. 30 tablet 0  . fluticasone (FLONASE) 50 MCG/ACT nasal spray fluticasone propionate 50 mcg/actuation nasal spray,suspension     No current facility-administered medications on file prior to visit.     Allergies  Allergen Reactions  . Clindamycin/Lincomycin Nausea Only and Hypertension  . Codeine     nightmares  . Penicillins Hives  . Tetracyclines & Related Hives  . Latex Hives    No past medical history on file.  Past Surgical History:  Procedure Laterality Date  . novasure      Family History  Problem Relation Age of Onset  . Osteoporosis Mother   . Diabetes Mother   . Alcohol abuse Father   . Heart disease Father 95       first MI at 40  . Diabetes Father     Social History   Socioeconomic History  . Marital status: Single    Spouse name: Not on file  . Number of children: Not on file  . Years of  education: Not on file  . Highest education level: Not on file  Occupational History  . Not on file  Social Needs  . Financial resource strain: Not on file  . Food insecurity:    Worry: Not on file    Inability: Not on file  . Transportation needs:    Medical: Not on file    Non-medical: Not on file  Tobacco Use  . Smoking status: Former Games developer  . Smokeless tobacco: Never Used  Substance and Sexual Activity  . Alcohol use: No    Alcohol/week: 0.0 standard drinks  . Drug use: No  . Sexual activity: Not on file  Lifestyle  . Physical activity:    Days per week: Not on file    Minutes per session: Not on file  . Stress: Not on file  Relationships  . Social connections:    Talks on phone: Not on file    Gets together: Not on file    Attends religious service: Not on file    Active member of club or organization: Not on file    Attends meetings of clubs or organizations: Not on file    Relationship status: Not on file  . Intimate partner violence:    Fear of current or ex partner: Not on file    Emotionally abused: Not on file  Physically abused: Not on file    Forced sexual activity: Not on file  Other Topics Concern  . Not on file  Social History Narrative   Engaged.  Works in [employer].         The PMH, PSH, Social History, Family History, Medications, and allergies have been reviewed in [facility], and have been updated if relevant.  Review of Systems  Eyes: Negative.   Respiratory: Negative.   Cardiovascular: Negative.   Gastrointestinal: Negative.   Musculoskeletal: Positive for arthralgias, back pain and neck stiffness.  Neurological: Positive for numbness. Negative for dizziness, tremors, seizures, syncope, facial asymmetry, speech difficulty, weakness, light-headedness and headaches.  All other systems reviewed and are negative.      Objective:    BP (!) 146/86 (BP Location: Left Arm, Cuff Size: Normal)   Pulse 76   Temp 98.3 F (36.8 C) (Oral)   Ht 5\' 7"   (1.702 m)   Wt 185 lb 9.6 oz (84.2 kg)   SpO2 98%   BMI 29.07 kg/m    Physical Exam  Constitutional: She is oriented to person, place, and time. She appears well-developed and well-nourished. No distress.  HENT:  Head: Normocephalic and atraumatic.  Eyes: EOM are normal.  Musculoskeletal:       Cervical back: She exhibits tenderness and pain. She exhibits normal range of motion, no bony tenderness, no swelling, no edema, no deformity, no laceration and no spasm.       Back:  Neurological: She is alert and oriented to person, place, and time. No cranial nerve deficit.  Skin: Skin is warm and dry. She is not diaphoretic.  Psychiatric: She has a normal mood and affect. Her behavior is normal. Judgment and thought content normal.          Assessment & Plan:   Cervical radiculopathy - Plan: DG Cervical Spine Complete No follow-ups on file.

## 2018-02-18 NOTE — Telephone Encounter (Signed)
Patient called in with c/o "back tightness and elevated BP." She says "I've been dealing with this a while, but it started back up yesterday. The tightness, pain is between my shoulder blades to my back, about a 4 pain. It's off and on pain. There is a numbing feeling that goes to the back of my arms." I asked about other symptoms, she says "my BP was elevated at 179/110. I went to exercise, walking outside and when I came back, my BP was 159/89. That's the only thing that's going on besides my back. No chest pain." According to protocol, see PCP within 3 days, she says "I'll take the appointment today." Appointment scheduled for today at 1215 with Dr. Dayton Martes, care advice given, patient verbalized understanding. She says she is due for a physical, I asked if she wants me to schedule the appointment, she says she will wait to make when she's at the office.  Reason for Disposition . [1] MODERATE back pain (e.g., interferes with normal activities) AND [2] present > 3 days  Answer Assessment - Initial Assessment Questions 1. ONSET: "When did the pain begin?"      On and off for a while, but started back up yesterday 2. LOCATION: "Where does it hurt?" (upper, mid or lower back)     Between shoulder blades 3. SEVERITY: "How bad is the pain?"  (e.g., Scale 1-10; mild, moderate, or severe)   - MILD (1-3): doesn't interfere with normal activities    - MODERATE (4-7): interferes with normal activities or awakens from sleep    - SEVERE (8-10): excruciating pain, unable to do any normal activities      4 4. PATTERN: "Is the pain constant?" (e.g., yes, no; constant, intermittent)      On and off 5. RADIATION: "Does the pain shoot into your legs or elsewhere?"     Goes to the back of arms, numb 6. CAUSE:  "What do you think is causing the back pain?"      I don't know 7. BACK OVERUSE:  "Any recent lifting of heavy objects, strenuous work or exercise?"     No 8. MEDICATIONS: "What have you taken so far for the  pain?" (e.g., nothing, acetaminophen, NSAIDS)     Ibuprofen 9. NEUROLOGIC SYMPTOMS: "Do you have any weakness, numbness, or problems with bowel/bladder control?"     No 10. OTHER SYMPTOMS: "Do you have any other symptoms?" (e.g., fever, abdominal pain, burning with urination, blood in urine)       High blood pressure 179/110 and down to 159/89 11. PREGNANCY: "Is there any chance you are pregnant?" (e.g., yes, no; LMP)       No  Protocols used: BACK PAIN-A-AH

## 2018-02-18 NOTE — Assessment & Plan Note (Signed)
New- probable DDD and trapezius spasm with bilateral UE radiculopathy. Advised heat, flexeril eRx refilled. Discussed changing height of her desk. Neck xray today. The patient indicates understanding of these issues and agrees with the plan. Orders Placed This Encounter  Procedures  . DG Cervical Spine Complete

## 2018-02-18 NOTE — Patient Instructions (Signed)
Great to see you.  Try heating pad, flexeril up to 3 times daily (but try at night first).  I'll call you with your xray results.  Keep checking your blood pressure.  Schedule a physical on your way out.

## 2018-02-19 ENCOUNTER — Telehealth: Payer: Self-pay

## 2018-02-19 DIAGNOSIS — M5030 Other cervical disc degeneration, unspecified cervical region: Secondary | ICD-10-CM

## 2018-02-19 DIAGNOSIS — M5412 Radiculopathy, cervical region: Secondary | ICD-10-CM

## 2018-02-19 NOTE — Telephone Encounter (Signed)
Pt states no she has never been to either of these. Pt states she would like referral to [facility]. Also, she is going to sign a release at her chiropractor's office to send the X-rays to him.  Pt wants to know does any of this explain her high bp?

## 2018-02-19 NOTE — Telephone Encounter (Signed)
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  Has she ever been to a spine specialist or orthopedist?

## 2018-02-19 NOTE — Telephone Encounter (Signed)
PEC-When pt calls back please find out if she has ever been to a Spine specialist or an Orthopedist?  Also ask if she has a preference for a referral/thx dmf

## 2018-02-20 NOTE — Telephone Encounter (Signed)
TA-Pt would like to be referred to [facility]/she wants to know if any of this explains her high BP/plz advise/thx dmf

## 2018-02-21 NOTE — Telephone Encounter (Signed)
Yes any pain can elevated blood pressure. Referral placed.

## 2018-02-21 NOTE — Addendum Note (Signed)
Addended by: Dianne Dun on: 02/21/2018 12:09 PM   Modules accepted: Orders

## 2018-02-28 ENCOUNTER — Telehealth: Payer: Self-pay

## 2018-02-28 NOTE — Telephone Encounter (Signed)
[place] from [facility] said pt wants to pick up cervical spine complete cd from [facility]; test was actually done at [facility]. Lauren in lab/radiology will get cd ready for pick up at front desk now. [place] voiced understanding and will let pt know.

## 2018-02-28 NOTE — Telephone Encounter (Signed)
Copied from CRM [id number]. Topic: General - Other >> Feb 28, 2018 10:14 AM Gerrianne Scale wrote: Reason for CRM: pt calling needing a copy of her xray of her spine but she would like to pick the copy up from the [facility] location please call pt to let her knoe if its ok >> Feb 28, 2018 10:18 AM Gerrianne Scale wrote: Pt need copy of xray today her appt is Monday Morning

## 2018-03-03 DIAGNOSIS — M542 Cervicalgia: Secondary | ICD-10-CM | POA: Diagnosis not present

## 2018-03-03 DIAGNOSIS — R030 Elevated blood-pressure reading, without diagnosis of hypertension: Secondary | ICD-10-CM | POA: Diagnosis not present

## 2018-03-03 NOTE — Telephone Encounter (Signed)
RI has taken care of this/see 2nd encounter same date/thx dmf

## 2018-03-26 ENCOUNTER — Ambulatory Visit (INDEPENDENT_AMBULATORY_CARE_PROVIDER_SITE_OTHER): Payer: BLUE CROSS/BLUE SHIELD | Admitting: Family Medicine

## 2018-03-26 ENCOUNTER — Encounter: Payer: Self-pay | Admitting: Family Medicine

## 2018-03-26 VITALS — BP 128/80 | HR 71 | Temp 98.6°F | Ht 67.0 in | Wt 183.1 lb

## 2018-03-26 DIAGNOSIS — F329 Major depressive disorder, single episode, unspecified: Secondary | ICD-10-CM | POA: Diagnosis not present

## 2018-03-26 DIAGNOSIS — Z1239 Encounter for other screening for malignant neoplasm of breast: Secondary | ICD-10-CM

## 2018-03-26 DIAGNOSIS — R42 Dizziness and giddiness: Secondary | ICD-10-CM

## 2018-03-26 DIAGNOSIS — Z0000 Encounter for general adult medical examination without abnormal findings: Secondary | ICD-10-CM | POA: Diagnosis not present

## 2018-03-26 DIAGNOSIS — Z114 Encounter for screening for human immunodeficiency virus [HIV]: Secondary | ICD-10-CM

## 2018-03-26 DIAGNOSIS — R5383 Other fatigue: Secondary | ICD-10-CM

## 2018-03-26 DIAGNOSIS — Z1159 Encounter for screening for other viral diseases: Secondary | ICD-10-CM | POA: Diagnosis not present

## 2018-03-26 DIAGNOSIS — E785 Hyperlipidemia, unspecified: Secondary | ICD-10-CM | POA: Diagnosis not present

## 2018-03-26 DIAGNOSIS — Z789 Other specified health status: Secondary | ICD-10-CM

## 2018-03-26 DIAGNOSIS — F32A Depression, unspecified: Secondary | ICD-10-CM

## 2018-03-26 LAB — LIPID PANEL
Cholesterol: 229 mg/dL — ABNORMAL HIGH (ref 0–200)
HDL: 60.2 mg/dL (ref >39.00)
LDL CALC: 144 mg/dL — AB (ref 0–99)
NONHDL: 168.44
Total CHOL/HDL Ratio: 4
Triglycerides: 123 mg/dL (ref 0.0–149.0)
VLDL: 24.6 mg/dL (ref 0.0–40.0)

## 2018-03-26 LAB — CBC WITH DIFFERENTIAL/PLATELET
BASOS ABS: 0 10*3/uL (ref 0.0–0.1)
Basophils Relative: 0.9 % (ref 0.0–3.0)
EOS ABS: 0.2 10*3/uL (ref 0.0–0.7)
Eosinophils Relative: 3.2 % (ref 0.0–5.0)
HEMATOCRIT: 42.7 % (ref 36.0–46.0)
Hemoglobin: 14.6 g/dL (ref 12.0–15.0)
LYMPHS PCT: 33.9 % (ref 12.0–46.0)
Lymphs Abs: 1.8 10*3/uL (ref 0.7–4.0)
MCHC: 34.3 g/dL (ref 30.0–36.0)
MCV: 88.8 fl (ref 78.0–100.0)
MONO ABS: 0.4 10*3/uL (ref 0.1–1.0)
Monocytes Relative: 6.9 % (ref 3.0–12.0)
NEUTROS ABS: 2.9 10*3/uL (ref 1.4–7.7)
NEUTROS PCT: 55.1 % (ref 43.0–77.0)
PLATELETS: 281 10*3/uL (ref 150.0–400.0)
RBC: 4.8 Mil/uL (ref 3.87–5.11)
RDW: 12.9 % (ref 11.5–15.5)
WBC: 5.3 10*3/uL (ref 4.0–10.5)

## 2018-03-26 LAB — COMPREHENSIVE METABOLIC PANEL
ALT: 15 U/L (ref 0–35)
AST: 16 U/L (ref 0–37)
Albumin: 4.9 g/dL (ref 3.5–5.2)
Alkaline Phosphatase: 70 U/L (ref 39–117)
BILIRUBIN TOTAL: 0.6 mg/dL (ref 0.2–1.2)
BUN: 11 mg/dL (ref 6–23)
CALCIUM: 10.3 mg/dL (ref 8.4–10.5)
CHLORIDE: 105 meq/L (ref 96–112)
CO2: 30 mEq/L (ref 19–32)
Creatinine, Ser: 0.8 mg/dL (ref 0.40–1.20)
GFR: 77.42 mL/min (ref >60.00)
GLUCOSE: 107 mg/dL — AB (ref 70–99)
Potassium: 4.9 mEq/L (ref 3.5–5.1)
SODIUM: 142 meq/L (ref 135–145)
Total Protein: 7.3 g/dL (ref 6.0–8.3)

## 2018-03-26 LAB — TSH: TSH: 3.82 u[IU]/mL (ref 0.35–4.50)

## 2018-03-26 NOTE — Assessment & Plan Note (Signed)
Reviewed preventive care protocols, scheduled due services, and updated immunizations Discussed nutrition, exercise, diet, and healthy lifestyle. Mammogram ordered- pt to schedule. Cologuard ordered.

## 2018-03-26 NOTE — Assessment & Plan Note (Signed)
Probable vertigo however she does often feel this when she isn't changing head or eye position. Will order carotid doppler for further evaluation. She does know how to do Eply maneuver and will continue to try this at home.

## 2018-03-26 NOTE — Assessment & Plan Note (Signed)
Has been diet controlled. Labs today. Orders Placed This Encounter  Procedures  . MM Digital Screening  . Hepatitis C Antibody  . HIV antibody (with reflex)  . CBC with Differential/Platelet  . Comprehensive metabolic panel  . Lipid panel  . TSH   The patient indicates understanding of these issues and agrees with the plan.

## 2018-03-26 NOTE — Patient Instructions (Addendum)
Great to see you. I will call you with your lab results from today and you can view them online.  Please call the breast center at [phone] to schedule your mammogram. Please call to schedule your appointment with your eye doctor.  We will call you with an appointment for your carotid dopplers.  [place]

## 2018-03-26 NOTE — Progress Notes (Signed)
Subjective:   Patient ID: Robin Fry, female    DOB: 02-06-1956, 62 y.o.   MRN: [id number]  Robin Fry is a pleasant 62 y.o. year old female who presents to clinic today with Annual Exam  on 03/26/2018  HPI:  Health Maintenance  Topic Date Due  . Hepatitis C Screening  11-02-1956  . HIV Screening  11/29/1971  . MAMMOGRAM  01/03/2018  . PAP SMEAR  04/27/2019  . COLONOSCOPY  02/12/2021  . TETANUS/TDAP  05/16/2026  . INFLUENZA VACCINE  Completed   Had pap smear done with GYN in 2017. Due for mammogram. Colonoscopy 09/16/08- but she is due for cologuard.  H/o HLD- Lab Results  Component Value Date   CHOL 212 (H) 04/24/2016   HDL 60.40 04/24/2016   LDLCALC 135 (H) 04/24/2016   TRIG 82.0 04/24/2016   CHOLHDL 4 04/24/2016   Dizziness- ongoing, almost daily now, lasts few minutes. Sometimes positional, other times it is not.  No nausea or vomiting. Current Outpatient Medications on File Prior to Visit  Medication Sig Dispense Refill  . amphetamine-dextroamphetamine (ADDERALL) 5 MG tablet Take 2 by mouth every morning 60 tablet 0  . bimatoprost (LATISSE) 0.03 % ophthalmic solution APPLY 1 APPLICATION TO EYELIDS ONCE DAILY  6  . cyclobenzaprine (FLEXERIL) 10 MG tablet Take 0.5-1 tablets (5-10 mg total) by mouth 3 (three) times daily as needed for muscle spasms. 30 tablet 0  . fluticasone (FLONASE) 50 MCG/ACT nasal spray fluticasone propionate 50 mcg/actuation nasal spray,suspension     No current facility-administered medications on file prior to visit.     Allergies  Allergen Reactions  . Clindamycin/Lincomycin Nausea Only and Hypertension  . Codeine     nightmares  . Penicillins Hives  . Tetracyclines & Related Hives  . Latex Hives    History reviewed. No pertinent past medical history.  Past Surgical History:  Procedure Laterality Date  . novasure      Family History  Problem Relation Age of Onset  . Osteoporosis Mother   . Diabetes Mother   . Alcohol abuse  Father   . Heart disease Father 46       first MI at 50  . Diabetes Father     Social History   Socioeconomic History  . Marital status: Single    Spouse name: Not on file  . Number of children: Not on file  . Years of education: Not on file  . Highest education level: Not on file  Occupational History  . Not on file  Social Needs  . Financial resource strain: Not on file  . Food insecurity:    Worry: Not on file    Inability: Not on file  . Transportation needs:    Medical: Not on file    Non-medical: Not on file  Tobacco Use  . Smoking status: Former Games developer  . Smokeless tobacco: Never Used  Substance and Sexual Activity  . Alcohol use: No    Alcohol/week: 0.0 standard drinks  . Drug use: No  . Sexual activity: Not on file  Lifestyle  . Physical activity:    Days per week: Not on file    Minutes per session: Not on file  . Stress: Not on file  Relationships  . Social connections:    Talks on phone: Not on file    Gets together: Not on file    Attends religious service: Not on file    Active member of club or organization: Not on file  Attends meetings of clubs or organizations: Not on file    Relationship status: Not on file  . Intimate partner violence:    Fear of current or ex partner: Not on file    Emotionally abused: Not on file    Physically abused: Not on file    Forced sexual activity: Not on file  Other Topics Concern  . Not on file  Social History Narrative   Engaged.  Works in [employer].         The PMH, PSH, Social History, Family History, Medications, and allergies [facility], and have been updated if relevant.   Review of Systems  Constitutional: Negative.   HENT: Negative.   Eyes: Negative.   Respiratory: Negative.   Cardiovascular: Negative.   Gastrointestinal: Negative.   Endocrine: Negative.   Genitourinary: Negative.   Musculoskeletal: Negative.   Allergic/Immunologic: Negative.   Neurological: Positive for  dizziness and headaches. Negative for tremors, seizures, syncope, facial asymmetry, speech difficulty, weakness, light-headedness and numbness.  Hematological: Negative.   Psychiatric/Behavioral: Negative.   All other systems reviewed and are negative.      Objective:    BP 128/80 (BP Location: Left Arm, Patient Position: Sitting, Cuff Size: Normal)   Pulse 71   Temp 98.6 F (37 C) (Oral)   Ht 5\' 7"  (1.702 m)   Wt 183 lb 2 oz (83.1 kg)   SpO2 98%   BMI 28.68 kg/m   Wt Readings from Last 3 Encounters:  03/26/18 183 lb 2 oz (83.1 kg)  02/18/18 185 lb 9.6 oz (84.2 kg)  11/11/17 183 lb (83 kg)    Physical Exam    General:  Well-developed,well-nourished,in no acute distress; alert,appropriate and cooperative throughout examination Head:  normocephalic and atraumatic.   Eyes:  vision grossly intact, PERRL Ears:  R ear normal and L ear normal externally, TMs clear bilaterally Nose:  no external deformity.   Mouth:  good dentition.   Neck:  No deformities, masses, or tenderness noted. Breasts:  No mass, nodules, thickening, tenderness, bulging, retraction, inflamation, nipple discharge or skin changes noted.   Lungs:  Normal respiratory effort, chest expands symmetrically. Lungs are clear to auscultation, no crackles or wheezes. Heart:  Normal rate and regular rhythm. S1 and S2 normal without gallop, murmur, click, rub or other extra sounds. Abdomen:  Bowel sounds positive,abdomen soft and non-tender without masses, organomegaly or hernias noted. Msk:  No deformity or scoliosis noted of thoracic or lumbar spine.   Extremities:  No clubbing, cyanosis, edema, or deformity noted with normal full range of motion of all joints.   Neurologic:  alert & oriented X3 and gait normal. Dix hallpike pos, no nystagmus   Skin:  Intact without suspicious lesions or rashes Cervical Nodes:  No lymphadenopathy noted Axillary Nodes:  No palpable lymphadenopathy Psych:  Cognition and judgment  appear intact. Alert and cooperative with normal attention span and concentration. No apparent delusions, illusions, hallucinations      Assessment & Plan:   Screening for breast cancer - Plan: MM Digital Screening  Well woman exam without gynecological exam  Fatigue due to depression  Need for hepatitis C screening test - Plan: Hepatitis C Antibody  Screening for HIV (human immunodeficiency virus) - Plan: HIV antibody (with reflex)  Hyperlipidemia, unspecified hyperlipidemia type - Plan: CBC with Differential/Platelet, Comprehensive metabolic panel, Lipid panel, TSH No follow-ups on file.

## 2018-03-27 LAB — HIV ANTIBODY (ROUTINE TESTING W REFLEX): HIV 1&2 Ab, 4th Generation: NONREACTIVE

## 2018-03-27 LAB — MEASLES/MUMPS/RUBELLA IMMUNITY
Mumps IgG: >300 AU/mL
RUBELLA: 26.4 {index}
Rubeola IgG: >300 AU/mL

## 2018-03-27 LAB — HEPATITIS C ANTIBODY
Hepatitis C Ab: NONREACTIVE
SIGNAL TO CUT-OFF: 0.01 (ref <1.00)

## 2018-04-03 ENCOUNTER — Encounter: Payer: Self-pay | Admitting: Family Medicine

## 2018-04-03 DIAGNOSIS — Z1211 Encounter for screening for malignant neoplasm of colon: Secondary | ICD-10-CM | POA: Diagnosis not present

## 2018-04-03 DIAGNOSIS — Z1212 Encounter for screening for malignant neoplasm of rectum: Secondary | ICD-10-CM | POA: Diagnosis not present

## 2018-04-03 LAB — COLOGUARD

## 2018-04-04 ENCOUNTER — Ambulatory Visit
Admission: RE | Admit: 2018-04-04 | Discharge: 2018-04-04 | Disposition: A | Discharge status: Home / Self Care | Payer: BLUE CROSS/BLUE SHIELD | Source: Ambulatory Visit | Attending: Family Medicine | Admitting: Family Medicine

## 2018-04-04 DIAGNOSIS — I6523 Occlusion and stenosis of bilateral carotid arteries: Secondary | ICD-10-CM | POA: Diagnosis not present

## 2018-04-04 DIAGNOSIS — R42 Dizziness and giddiness: Secondary | ICD-10-CM

## 2018-04-09 ENCOUNTER — Encounter: Payer: Self-pay | Admitting: Family Medicine

## 2018-04-30 DIAGNOSIS — D2262 Melanocytic nevi of left upper limb, including shoulder: Secondary | ICD-10-CM | POA: Diagnosis not present

## 2018-04-30 DIAGNOSIS — D2261 Melanocytic nevi of right upper limb, including shoulder: Secondary | ICD-10-CM | POA: Diagnosis not present

## 2018-04-30 DIAGNOSIS — L300 Nummular dermatitis: Secondary | ICD-10-CM | POA: Diagnosis not present

## 2018-04-30 DIAGNOSIS — D2272 Melanocytic nevi of left lower limb, including hip: Secondary | ICD-10-CM | POA: Diagnosis not present

## 2018-05-21 ENCOUNTER — Ambulatory Visit: Payer: BLUE CROSS/BLUE SHIELD

## 2018-06-04 ENCOUNTER — Ambulatory Visit
Admission: RE | Admit: 2018-06-04 | Discharge: 2018-06-04 | Disposition: A | Discharge status: Home / Self Care | Payer: 59 | Source: Ambulatory Visit | Attending: Family Medicine | Admitting: Family Medicine

## 2018-06-04 DIAGNOSIS — Z1239 Encounter for other screening for malignant neoplasm of breast: Secondary | ICD-10-CM

## 2018-11-28 ENCOUNTER — Encounter: Payer: Self-pay | Admitting: Family Medicine

## 2018-11-28 ENCOUNTER — Ambulatory Visit (INDEPENDENT_AMBULATORY_CARE_PROVIDER_SITE_OTHER): Payer: 59 | Admitting: Family Medicine

## 2018-11-28 VITALS — BP 150/90 | HR 66 | Temp 98.3°F | Ht 67.0 in | Wt 187.0 lb

## 2018-11-28 DIAGNOSIS — W57XXXA Bitten or stung by nonvenomous insect and other nonvenomous arthropods, initial encounter: Secondary | ICD-10-CM | POA: Diagnosis not present

## 2018-11-28 DIAGNOSIS — S50861A Insect bite (nonvenomous) of right forearm, initial encounter: Secondary | ICD-10-CM | POA: Diagnosis not present

## 2018-11-28 MED ORDER — TRIAMCINOLONE ACETONIDE 0.1 % EX CREA: 1.0000 "application " | TOPICAL_CREAM | Freq: Three times a day (TID) | CUTANEOUS | 1 refills | Status: DC

## 2018-11-28 MED ORDER — MUPIROCIN 2 % EX OINT: 1.0000 "application " | TOPICAL_OINTMENT | Freq: Two times a day (BID) | CUTANEOUS | 0 refills | Status: AC

## 2018-11-28 NOTE — Progress Notes (Signed)
Robin Fry is a 63 y.o. female  Chief Complaint  Patient presents with  . Insect Bite    right arm has bites red (looks like scratch marks) 7 days/ red itchy/ started oozaing 2 days (yellowish ooze)/ used benedryl    HPI: Robin Fry is a 63 y.o. female who is a patient of my colleague Dr. Deborra Medina who presents today with insect bites (gnats as per pt) on her Rt forearm x 7 days. The initial bites were "small, red dots like bumps", + itch. Pt states she has been scratching incessantly and feels this may have caused an infection. She states the bites starting oozing yesterday with a clear yellowish drainage. No fever, chills, pain.  She applied OTC hydrocortisone cream and took benadryl last night.  No past medical history on file.  Past Surgical History:  Procedure Laterality Date  . novasure      Social History   Socioeconomic History  . Marital status: Single    Spouse name: Not on file  . Number of children: Not on file  . Years of education: Not on file  . Highest education level: Not on file  Occupational History  . Not on file  Social Needs  . Financial resource strain: Not on file  . Food insecurity    Worry: Not on file    Inability: Not on file  . Transportation needs    Medical: Not on file    Non-medical: Not on file  Tobacco Use  . Smoking status: Former Research scientist (life sciences)  . Smokeless tobacco: Never Used  Substance and Sexual Activity  . Alcohol use: No    Alcohol/week: 0.0 standard drinks  . Drug use: No  . Sexual activity: Not on file  Lifestyle  . Physical activity    Days per week: Not on file    Minutes per session: Not on file  . Stress: Not on file  Relationships  . Social Herbalist on phone: Not on file    Gets together: Not on file    Attends religious service: Not on file    Active member of club or organization: Not on file    Attends meetings of clubs or organizations: Not on file    Relationship status: Not on file  . Intimate  partner violence    Fear of current or ex partner: Not on file    Emotionally abused: Not on file    Physically abused: Not on file    Forced sexual activity: Not on file  Other Topics Concern  . Not on file  Social History Narrative   Engaged.  Works in [place].          Family History  Problem Relation Age of Onset  . Osteoporosis Mother   . Diabetes Mother   . Alcohol abuse Father   . Heart disease Father 63       first MI at 73  . Diabetes Father      Immunization History  Administered Date(s) Administered  . Influenza Inj Mdck Quad Pf 03/01/2017  . Influenza,inj,Quad PF,6+ Mos 06/15/2016, 02/15/2018  . Tdap 05/16/2016  . Zoster Recombinat (Shingrix) 01/10/2018    Outpatient Encounter Medications as of 11/28/2018  Medication Sig  . amphetamine-dextroamphetamine (ADDERALL) 5 MG tablet Take 2 by mouth every morning  . bimatoprost (LATISSE) 0.03 % ophthalmic solution APPLY 1 APPLICATION TO EYELIDS ONCE DAILY  . cyclobenzaprine (FLEXERIL) 10 MG tablet Take 0.5-1 tablets (5-10 mg total) by mouth  3 (three) times daily as needed for muscle spasms.  Marland Kitchen. estradiol (ESTRACE) 1 MG tablet Take by mouth.  . fluticasone (FLONASE) 50 MCG/ACT nasal spray fluticasone propionate 50 mcg/actuation nasal spray,suspension  . medroxyPROGESTERone (PROVERA) 5 MG tablet Take by mouth.  . mupirocin ointment (BACTROBAN) 2 % Apply 1 application topically 2 (two) times daily for 10 days.  Marland Kitchen. triamcinolone cream (KENALOG) 0.1 % Apply 1 application topically 3 (three) times daily.   No facility-administered encounter medications on file as of 11/28/2018.      ROS: Pertinent positives and negatives noted in HPI. Remainder of ROS non-contributory  Allergies  Allergen Reactions  . Clindamycin/Lincomycin Nausea Only and Hypertension  . Codeine     nightmares  . Penicillins Hives  . Tetracyclines & Related Hives  . Latex Hives    BP (!) 150/90   Pulse 66   Temp 98.3 F (36.8 C) (Oral)   Ht  5\' 7"  (1.702 m)   Wt 187 lb (84.8 kg)   SpO2 98%   BMI 29.29 kg/m   BP Readings from Last 3 Encounters:  11/28/18 (!) 150/90  03/26/18 128/80  02/18/18 (!) 146/86    Physical Exam  Constitutional: She is oriented to person, place, and time. She appears well-developed and well-nourished. No distress.  Neurological: She is alert and oriented to person, place, and time.  Skin: Rash noted. Rash is papular.     Psychiatric: She has a normal mood and affect. Her behavior is normal.     A/P:  1. Insect bite of right forearm, initial encounter Rx: - triamcinolone cream (KENALOG) 0.1 %; Apply 1 application topically 3 (three) times daily.  Dispense: 30 g; Refill: 1 - mupirocin ointment (BACTROBAN) 2 %; Apply 1 application topically 2 (two) times daily for 10 days.  Dispense: 20 g; Refill: 0 - f/u if symptoms worsen or do not improve in 7-10 days Discussed plan and reviewed medications with patient, including risks, benefits, and potential side effects. Pt expressed understand. All questions answered.

## 2019-04-22 ENCOUNTER — Encounter: Payer: Self-pay | Admitting: Family Medicine

## 2019-04-22 ENCOUNTER — Ambulatory Visit (INDEPENDENT_AMBULATORY_CARE_PROVIDER_SITE_OTHER): Payer: 59 | Admitting: Family Medicine

## 2019-04-22 ENCOUNTER — Other Ambulatory Visit: Payer: Self-pay

## 2019-04-22 VITALS — BP 150/90 | HR 80 | Temp 98.1°F | Ht 67.0 in | Wt 188.8 lb

## 2019-04-22 DIAGNOSIS — M898X7 Other specified disorders of bone, ankle and foot: Secondary | ICD-10-CM | POA: Diagnosis not present

## 2019-04-22 DIAGNOSIS — M21622 Bunionette of left foot: Secondary | ICD-10-CM | POA: Diagnosis not present

## 2019-04-22 NOTE — Progress Notes (Signed)
Robin Pinela T. Patte Winkel, MD Primary Care and [place] at [facility] [place], [address] Phone: [phone]  FAX: [phone]  Robin Fry - 63 y.o. female  MRN [id number]  Date of Birth: 1955-07-01  Visit Date: 04/22/2019  PCP: Robin Passy, MD  Referred by: Robin Passy, MD  Chief Complaint  Patient presents with  . Foot Pain    Left    This visit occurred during the SARS-CoV-2 public health emergency.  Safety protocols were in place, including screening questions prior to the visit, additional usage of staff PPE, and extensive cleaning of exam room while observing appropriate contact time as indicated for disinfecting solutions.   Subjective:   Robin Fry is a 63 y.o. very pleasant female patient with Body mass index is 29.56 kg/m. who presents with the following:  63 year old presents with foot pain:  [place] with pain at the 5th.  Always and will happen.  It does not really go away when she is sitting or lying down.  She has pain at the fifth MTP joint but she also has some as well and the shaft to a much minor degree.  She is not having any pain at the base of the fifth.  She is not had any trauma or injury at all.  Past Medical History, Surgical History, Social History, Family History, Problem List, Medications, and Allergies have been reviewed and updated if relevant.  Patient Active Problem List   Diagnosis Date Noted  . Well woman exam without gynecological exam 03/26/2018  . HLD (hyperlipidemia) 03/26/2018  . Dizziness 03/26/2018  . Fatigue 11/25/2014  . Carpal tunnel syndrome 04/29/2014  . ADD (attention deficit disorder) 09/12/2012    History reviewed. No pertinent past medical history.  Past Surgical History:  Procedure Laterality Date  . novasure      Social History   Socioeconomic History  . Marital status: Single    Spouse name: Not on file  . Number of children: Not on file  .  Years of education: Not on file  . Highest education level: Not on file  Occupational History  . Not on file  Social Needs  . Financial resource strain: Not on file  . Food insecurity    Worry: Not on file    Inability: Not on file  . Transportation needs    Medical: Not on file    Non-medical: Not on file  Tobacco Use  . Smoking status: Former Research scientist (life sciences)  . Smokeless tobacco: Never Used  Substance and Sexual Activity  . Alcohol use: No    Alcohol/week: 0.0 standard drinks  . Drug use: No  . Sexual activity: Not on file  Lifestyle  . Physical activity    Days per week: Not on file    Minutes per session: Not on file  . Stress: Not on file  Relationships  . Social Herbalist on phone: Not on file    Gets together: Not on file    Attends religious service: Not on file    Active member of club or organization: Not on file    Attends meetings of clubs or organizations: Not on file    Relationship status: Not on file  . Intimate partner violence    Fear of current or ex partner: Not on file    Emotionally abused: Not on file    Physically abused: Not on file    Forced sexual  activity: Not on file  Other Topics Concern  . Not on file  Social History Narrative   Engaged.  Works in [employer].          Family History  Problem Relation Age of Onset  . Osteoporosis Mother   . Diabetes Mother   . Alcohol abuse Father   . Heart disease Father 6       first MI at 54  . Diabetes Father     Allergies  Allergen Reactions  . Clindamycin/Lincomycin Nausea Only and Hypertension  . Codeine     nightmares  . Penicillins Hives  . Tetracyclines & Related Hives  . Latex Hives    Medication list reviewed and updated in full in [facility].  GEN: No fevers, chills. Nontoxic. Primarily MSK c/o today. MSK: Detailed in the HPI GI: tolerating PO intake without difficulty Neuro: No numbness, parasthesias, or tingling associated. Otherwise the pertinent positives of  the ROS are noted above.   Objective:   BP (!) 150/90   Pulse 80   Temp 98.1 F (36.7 C) (Temporal)   Ht 5\' 7"  (1.702 m)   Wt 188 lb 12 oz (85.6 kg)   SpO2 98%   BMI 29.56 kg/m    GEN: WDWN, NAD, Non-toxic, Alert & Oriented x 3 HEENT: Atraumatic, Normocephalic.  Ears and Nose: No external deformity. EXTR: No clubbing/cyanosis/edema NEURO: Normal gait.  PSYCH: Normally interactive. Conversant. Not depressed or anxious appearing.  Calm demeanor.    Bilateral foot and ankle exam:.  She has no significant tenderness at the medial or lateral malleolus.  All ligamentous structures of the ankle are intact.  The entirety of the midfoot is intact.  Peroneal and posterior tibialis tendons are nontender.  Inversion and eversion along with plantar flexion and dorsiflexion are normal.  She does have some tenderness at the fifth MTP joint and this is the area of maximal tenderness.  Minor tenderness along the distal shaft of the fifth.  With standing the patient has a very wide forefoot with significant foot breakdown and toe splaying at 1 2, significant bunionette and her fifth is going underneath the fourth and the first is going underneath the second.  Radiology: No results found.  Assessment and Plan:     ICD-10-CM   1. Bunionette of left foot  M21.622   2. Pain in metatarsus of left foot  M89.8X7    >25 minutes spent in face to face time with patient, >50% spent in counselling or coordination of care   She has pain at the bunionette on the left with some significant forefoot breakdown.  Significant transverse arch collapse.  I placed a metatarsal bar on the patient's insole which is quite good native insole.  If this does not work at follow-up I can certainly place a metatarsal pad instead.  I think that the primary issue is too small of the toe box given the wide forefoot and I gave her some different shoes that would fit her foot better     Follow-up: 1 mo  No orders of the  defined types were placed in this encounter.  No orders of the defined types were placed in this encounter.   Signed,  . Piedad Standiford, MD   Outpatient Encounter Medications as of 04/22/2019  Medication Sig  . amphetamine-dextroamphetamine (ADDERALL) 5 MG tablet Take 2 by mouth every morning  . fluticasone (FLONASE) 50 MCG/ACT nasal spray fluticasone propionate 50 mcg/actuation nasal spray,suspension  . [DISCONTINUED] bimatoprost (LATISSE)  0.03 % ophthalmic solution APPLY 1 APPLICATION TO EYELIDS ONCE DAILY  . [DISCONTINUED] cyclobenzaprine (FLEXERIL) 10 MG tablet Take 0.5-1 tablets (5-10 mg total) by mouth 3 (three) times daily as needed for muscle spasms.  . [DISCONTINUED] estradiol (ESTRACE) 1 MG tablet Take by mouth.  . [DISCONTINUED] medroxyPROGESTERone (PROVERA) 5 MG tablet Take by mouth.  . [DISCONTINUED] triamcinolone cream (KENALOG) 0.1 % Apply 1 application topically 3 (three) times daily.   No facility-administered encounter medications on file as of 04/22/2019.

## 2019-04-22 NOTE — Patient Instructions (Signed)
   SHOES: Danskos, Merrells, [place]ens, [place], Birkenstocks - good arch support, want minimal bendability. Finn Comfort also excellent, but even more expensive.  Tennis Shoes / Running Shoes: Many good companies and styles. The most important thing is to get a good fit and wear a shoe that feels good in the store. Walk around in the store. Run if that is something that you can do. Some of the running stores have a treadmill, also.  Brooks, New Balance, Karleen Hampshire are good, but the most important thing is to wear a shoe that fits your foot and feels good.  Nicer shoes:  Birkenstock shoes, [place]: Excellent selection  THE SHOE [employer]ET, [place], [place]: They have the largest selection of comfort and supportive shoes that I have ever seen, and their staff is generally quite good in fitting you for shoes. (They will intermittently have sales, mail coupons, and you can look on their website.)

## 2019-04-29 ENCOUNTER — Encounter: Payer: Self-pay | Admitting: Family Medicine

## 2019-04-30 ENCOUNTER — Other Ambulatory Visit: Payer: Self-pay | Admitting: Family Medicine

## 2019-04-30 DIAGNOSIS — G4733 Obstructive sleep apnea (adult) (pediatric): Secondary | ICD-10-CM

## 2019-05-06 ENCOUNTER — Encounter: Payer: Self-pay | Admitting: Pulmonary Disease

## 2019-05-06 ENCOUNTER — Ambulatory Visit (INDEPENDENT_AMBULATORY_CARE_PROVIDER_SITE_OTHER): Payer: Managed Care, Other (non HMO) | Admitting: Pulmonary Disease

## 2019-05-06 ENCOUNTER — Other Ambulatory Visit: Payer: Self-pay

## 2019-05-06 VITALS — BP 142/72 | HR 73 | Ht 67.0 in | Wt 190.8 lb

## 2019-05-06 DIAGNOSIS — G4733 Obstructive sleep apnea (adult) (pediatric): Secondary | ICD-10-CM

## 2019-05-06 NOTE — Patient Instructions (Signed)
History of obstructive sleep apnea Postnasal drip/allergies  We will order a home sleep study  Trial with nasal steroid-Nasonex/Flonase Oral allergy pills Xyzal or Zyrtec  We will see you in 6 weeks  Options of treatment as discussed will be CPAP or an oral device depending on your preference

## 2019-05-06 NOTE — Progress Notes (Signed)
Subjective:    Patient ID: Robin Fry, female    DOB: 05/23/1955, 63 y.o.   MRN: [id number]  Patient being seen for nonrestorative sleep  Diagnosed with obstructive sleep apnea-mild to moderate at about 2012 She used CPAP for about a month Just could not get used to using CPAP at the time and she discontinued using it  Nonrestorative sleep Has been recorded snoring with abnormal breathing at night She does get the sensation of nasal stuffiness and congestion, postnasal drip Sensation of her tongue rolling back  Usually tries to sleep on her side  Bedtime is between 8 and 10 PM Takes a few minutes to fall asleep Wakes up about twice at night  Final awakening time about 5 AM  About 30 pound weight gain in the last couple years  No family history of sleep apnea  She does hold her mouth open during the night, occasional dryness of the mouth No headaches   History reviewed. No pertinent past medical history.  Social History   Socioeconomic History  . Marital status: Single    Spouse name: Not on file  . Number of children: Not on file  . Years of education: Not on file  . Highest education level: Not on file  Occupational History  . Not on file  Tobacco Use  . Smoking status: Former Research scientist (life sciences)  . Smokeless tobacco: Never Used  Substance and Sexual Activity  . Alcohol use: No    Alcohol/week: 0.0 standard drinks  . Drug use: No  . Sexual activity: Not on file  Other Topics Concern  . Not on file  Social History Narrative   Engaged.  Works in [place].         Social Determinants of Health   Financial Resource Strain:   . Difficulty of Paying Living Expenses: Not on file  Food Insecurity:   . Worried About Charity fundraiser in the Last Year: Not on file  . Ran Out of Food in the Last Year: Not on file  Transportation Needs:   . Lack of Transportation (Medical): Not on file  . Lack of Transportation (Non-Medical): Not on file  Physical Activity:   . Days  of Exercise per Week: Not on file  . Minutes of Exercise per Session: Not on file  Stress:   . Feeling of Stress : Not on file  Social Connections:   . Frequency of Communication with Friends and Family: Not on file  . Frequency of Social Gatherings with Friends and Family: Not on file  . Attends Religious Services: Not on file  . Active Member of Clubs or Organizations: Not on file  . Attends Archivist Meetings: Not on file  . Marital Status: Not on file  Intimate Partner Violence:   . Fear of Current or Ex-Partner: Not on file  . Emotionally Abused: Not on file  . Physically Abused: Not on file  . Sexually Abused: Not on file    Review of Systems  Constitutional: Positive for unexpected weight change. Negative for fever.  HENT: Positive for dental problem and postnasal drip. Negative for congestion, ear pain, nosebleeds, rhinorrhea, sinus pressure, sneezing, sore throat and trouble swallowing.   Eyes: Negative for redness and itching.  Respiratory: Negative for cough, chest tightness, shortness of breath and wheezing.   Cardiovascular: Negative for palpitations and leg swelling.  Gastrointestinal: Negative for nausea and vomiting.  Genitourinary: Negative for dysuria.  Musculoskeletal: Positive for joint swelling.  Skin: Negative for  rash.  Allergic/Immunologic: Negative.  Negative for environmental allergies, food allergies and immunocompromised state.  Neurological: Negative for headaches.  Hematological: Does not bruise/bleed easily.  Psychiatric/Behavioral: Negative for dysphoric mood. The patient is not nervous/anxious.        Objective:   Physical Exam Constitutional:      Appearance: Normal appearance.  HENT:     Head: Normocephalic.     Nose: Nose normal.     Mouth/Throat:     Mouth: Mucous membranes are moist.     Comments: Mallampati 2 Eyes:     Pupils: Pupils are equal, round, and reactive to light.  Cardiovascular:     Rate and Rhythm: Normal  rate and regular rhythm.     Pulses: Normal pulses.     Heart sounds: Normal heart sounds. No murmur. No friction rub.  Pulmonary:     Effort: Pulmonary effort is normal. No respiratory distress.     Breath sounds: Normal breath sounds. No stridor. No wheezing or rhonchi.  Musculoskeletal:        General: No swelling. Normal range of motion.     Cervical back: Normal range of motion and neck supple. No rigidity or tenderness.  Skin:    General: Skin is warm.  Neurological:     General: No focal deficit present.     Mental Status: She is alert.  Psychiatric:        Mood and Affect: Mood normal.        Behavior: Behavior normal.     Vitals:   05/06/19 1009  BP: (!) 142/72  Pulse: 73  SpO2: 98%   Results of the Epworth flowsheet 05/06/2019  Sitting and reading 0  Watching TV 1  Sitting, inactive in a public place (e.g. a theatre or a meeting) 0  As a passenger in a car for an hour without a break 0  Lying down to rest in the afternoon when circumstances permit 2  Sitting and talking to someone 0  Sitting quietly after a lunch without alcohol 0  In a car, while stopped for a few minutes in traffic 0  Total score 3   Previous study not available     Assessment & Plan:  .  High probability of significant obstructive sleep apnea -History of obstructive sleep apnea -Symptoms suggesting sleep apnea -Nonrestorative sleep  As patient did not tolerate CPAP in the past -Options of treatment fully discussed including CPAP, oral device, surgery on the tongue, throat surgery, mandibular advancement procedures, tracheostomy  .  Treatment for postnasal drip/allergies -Trial with nasal steroid and Xyzal  .  Will schedule patient for home sleep study -Plan for options of treatment including CPAP versus oral device  .  I will see her back in about 6 to 8 weeks  .  Encouraged to stay active, exercise regularly  .  Encouraged to call with any significant concerns -

## 2019-05-08 ENCOUNTER — Encounter: Payer: Self-pay | Admitting: Family Medicine

## 2019-05-11 ENCOUNTER — Other Ambulatory Visit: Payer: Self-pay

## 2019-05-11 MED ORDER — CYCLOBENZAPRINE HCL 10 MG PO TABS: 5.0000 mg | ORAL_TABLET | Freq: Three times a day (TID) | ORAL | 0 refills | Status: DC | PRN

## 2019-06-11 ENCOUNTER — Encounter: Payer: Self-pay | Admitting: Family Medicine

## 2019-06-19 ENCOUNTER — Other Ambulatory Visit: Payer: Self-pay

## 2019-06-19 ENCOUNTER — Ambulatory Visit: Payer: No Typology Code available for payment source

## 2019-06-19 DIAGNOSIS — G4733 Obstructive sleep apnea (adult) (pediatric): Secondary | ICD-10-CM | POA: Diagnosis not present

## 2019-06-24 ENCOUNTER — Telehealth: Payer: Self-pay | Admitting: Pulmonary Disease

## 2019-06-24 DIAGNOSIS — G4733 Obstructive sleep apnea (adult) (pediatric): Secondary | ICD-10-CM

## 2019-06-24 NOTE — Telephone Encounter (Signed)
Dr. Wynona Neat has reviewed the home sleep test this showed mild obstructive sleep apnea.   Recommendations   Treatment options are CPAP with the settings auto 5 to 15.    Continuing weight loss efforts, regular exercise and sleep position modification-ensuring to avoid supine sleep as able are other options of treatment and CPAP may be considered if these efforts do not help.   Advise against driving while sleepy & against medication with sedative side effects.    Make appointment for 3 months for compliance with download with Dr. Wynona Neat if pt decides to do CPAP therapy.     Called and spoke with pt letting her know the results of the HST and stated to pt the two options of doing cpap tx or continuing weight loss efforts. Pt stated she did not want to go through with CPAP as she has tried it before but could not tolerate it.  Pt wanted to know if she could have a referral to be fitted with the oral device to see if that would help. Dr. Val Eagle, please advise if we can refer pt to Dr. Myrtis Ser to have her be fitted for the oral appliance to help out with her mild OSA?

## 2019-06-24 NOTE — Telephone Encounter (Signed)
Referral to dentist for evaluation for an oral device for mild obstructive sleep apnea  An oral device will be appropriate

## 2019-06-24 NOTE — Telephone Encounter (Signed)
Called the patient and made her aware of the recommendation from Dr. Wynona Neat based on sleep test.  Patient agreed to referral. Patient asked if this would fall under a medical device or dental device. I advised I would forward that information in the referral in addition to asking if Dr. Myrtis Ser is in the patient network.  Order placed, nothing further needed at this time.

## 2020-01-11 ENCOUNTER — Encounter: Payer: No Typology Code available for payment source | Admitting: Family Medicine

## 2020-03-23 ENCOUNTER — Ambulatory Visit (INDEPENDENT_AMBULATORY_CARE_PROVIDER_SITE_OTHER): Payer: No Typology Code available for payment source | Admitting: Family Medicine

## 2020-03-23 ENCOUNTER — Other Ambulatory Visit: Payer: Self-pay

## 2020-03-23 ENCOUNTER — Encounter: Payer: Self-pay | Admitting: Family Medicine

## 2020-03-23 VITALS — BP 130/90 | HR 70 | Temp 98.4°F | Ht 67.0 in | Wt 178.8 lb

## 2020-03-23 DIAGNOSIS — M7050 Other bursitis of knee, unspecified knee: Secondary | ICD-10-CM | POA: Diagnosis not present

## 2020-03-23 DIAGNOSIS — M25461 Effusion, right knee: Secondary | ICD-10-CM | POA: Diagnosis not present

## 2020-03-23 DIAGNOSIS — M79661 Pain in right lower leg: Secondary | ICD-10-CM

## 2020-03-23 NOTE — Progress Notes (Signed)
Murice Barbar T. Yailin Biederman, MD, CAQ Sports Medicine  Primary Care and Sports Medicine [facility] at [facility] [address] [place], [address]  Phone: [phone]  FAX: [phone]  Robin Fry - 64 y.o. female  MRN [id number]  Date of Birth: 1955/06/02  Date: 03/23/2020  PCP: No primary care provider on file.  Referral: No ref. provider found  Chief Complaint  Patient presents with  . Knee Pain    Right    This visit occurred during the SARS-CoV-2 public health emergency.  Safety protocols were in place, including screening questions prior to the visit, additional usage of staff PPE, and extensive cleaning of exam room while observing appropriate contact time as indicated for disinfecting solutions.   Subjective:   Robin Fry is a 64 y.o. very pleasant female patient with Body mass index is 28 kg/m. 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  Stared more in the back of her knee and felt tense.  It was a little hot.    He does not recall any sort of specific injury.  She is predominantly having pain on the lateral aspect of the right with some minor pain just distal to the medial compartment.  She also has pain posteriorly and in the lateral calf.  She has had an intermittent effusion, but that is doing much better today. She was actually able to run a 5K over the weekend, albeit much slower than she normally would.  R lateral gastroc Pes bursitis  Review of Systems is noted in the HPI, as appropriate   Objective:   BP 130/90   Pulse 70   Temp 98.4 F (36.9 C) (Temporal)   Ht 5\' 7"  (1.702 m)   Wt 178 lb 12 oz (81.1 kg)   SpO2 98%   BMI 28.00 kg/m   Right knee: Full extension.  Flexion to 130.  Stable to varus and valgus stress.  ACL and PCL are intact.  Mild effusion.  Nontender throughout the length of the  tibia and fibula.  She does have some mild anserine bursitis.  Nontender along the medial joint line.  She does have some mild lateral joint line tenderness.  She also has tenderness along the lateral gastroc as well as the lateral gastroc tendon at its insertion.  She has a negative dial test.  McMurray's, flexion pinch, and bounce home test were all negative.  Homans test is negative  Radiology: No results found.  Assessment and Plan:     ICD-10-CM   1. Right calf pain  M79.661   2. Anserine bursitis  M70.50   3. Effusion of right knee  M25.461    Reassuring exam.  Reassuring that she ran a 5K.  I do think that she has some gastrocnemius tendon tendinopathy.  She certainly has some mild pes anserine bursitis as well.  Continue to ice knee and NSAIDs as needed.  We reviewed some activity modification, and she is well versed in athletics and also musculoskeletal management.  She already has foam rollers, wands, a TENS unit, and multiple other modalities at home.  Crosstraining for now including the pedal machine that she has at home, biking, and ergometer.  Follow-up: No follow-ups on file.  Signed,  . Quintella Mura, MD   Outpatient Encounter Medications as of 03/23/2020  Medication Sig  . amphetamine-dextroamphetamine (ADDERALL)  5 MG tablet Take 2 by mouth every morning  . fluticasone (FLONASE) 50 MCG/ACT nasal spray fluticasone propionate 50 mcg/actuation nasal spray,suspension  . [DISCONTINUED] cyclobenzaprine (FLEXERIL) 10 MG tablet Take 0.5-1 tablets (5-10 mg total) by mouth 3 (three) times daily as needed for muscle spasms.   No facility-administered encounter medications on file as of 03/23/2020.

## 2020-08-08 ENCOUNTER — Other Ambulatory Visit: Payer: Self-pay | Admitting: Obstetrics and Gynecology

## 2020-08-08 DIAGNOSIS — Z1231 Encounter for screening mammogram for malignant neoplasm of breast: Secondary | ICD-10-CM

## 2020-08-09 ENCOUNTER — Other Ambulatory Visit: Payer: Self-pay

## 2020-08-09 ENCOUNTER — Ambulatory Visit
Admission: RE | Admit: 2020-08-09 | Discharge: 2020-08-09 | Disposition: A | Discharge status: Home / Self Care | Payer: No Typology Code available for payment source | Source: Ambulatory Visit | Attending: Obstetrics and Gynecology | Admitting: Obstetrics and Gynecology

## 2020-08-09 DIAGNOSIS — Z1231 Encounter for screening mammogram for malignant neoplasm of breast: Secondary | ICD-10-CM

## 2020-08-11 ENCOUNTER — Other Ambulatory Visit: Payer: Self-pay | Admitting: Obstetrics and Gynecology

## 2020-08-11 DIAGNOSIS — R928 Other abnormal and inconclusive findings on diagnostic imaging of breast: Secondary | ICD-10-CM

## 2020-08-31 ENCOUNTER — Other Ambulatory Visit: Payer: Self-pay

## 2020-08-31 ENCOUNTER — Ambulatory Visit
Admission: RE | Admit: 2020-08-31 | Discharge: 2020-08-31 | Disposition: A | Discharge status: Home / Self Care | Payer: No Typology Code available for payment source | Source: Ambulatory Visit | Attending: Obstetrics and Gynecology | Admitting: Obstetrics and Gynecology

## 2020-08-31 ENCOUNTER — Ambulatory Visit: Payer: No Typology Code available for payment source

## 2020-08-31 DIAGNOSIS — R928 Other abnormal and inconclusive findings on diagnostic imaging of breast: Secondary | ICD-10-CM

## 2020-11-03 IMAGING — US US CAROTID DUPLEX BILAT
1 series · 13 of 24 positions shown · non-contrast
Comparison: None.

CLINICAL DATA: Intermittent dizziness

EXAM:
BILATERAL CAROTID DUPLEX ULTRASOUND
TECHNIQUE: Gray scale imaging, color Doppler and duplex ultrasound were
performed of bilateral carotid and vertebral arteries in the neck.

[Series 1: us carotid duplex bilat · 0.06mm/px · 13 of 44 slices shown]
[im 1/44]
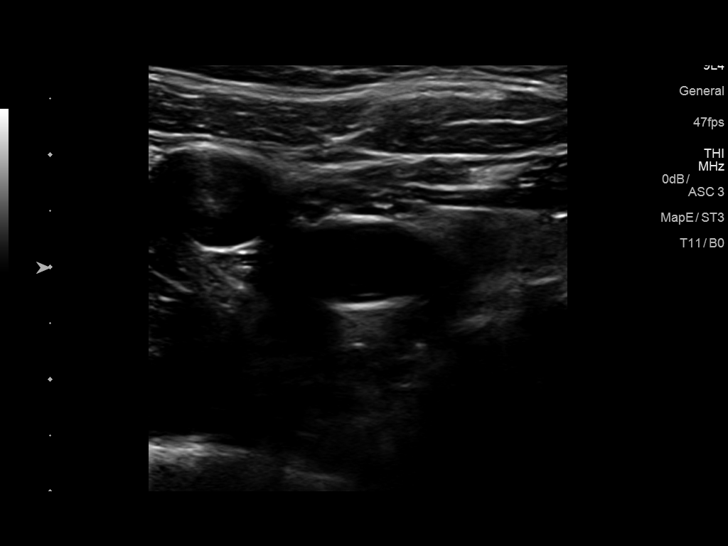
[im 4/44]
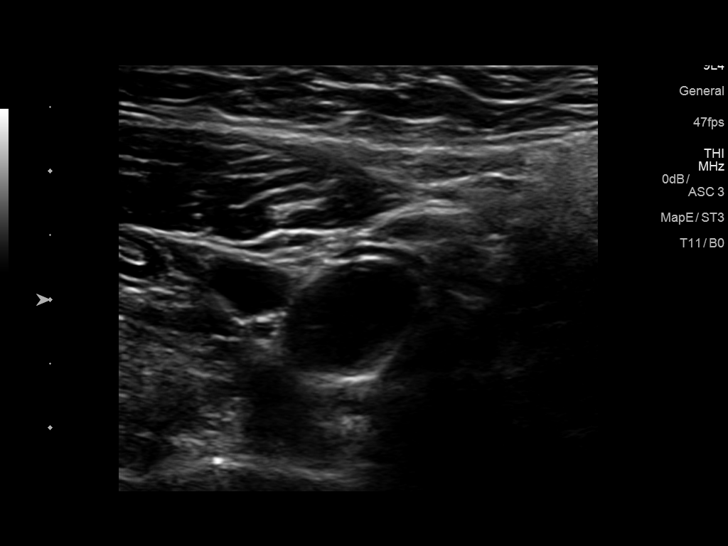
[im 8/44]
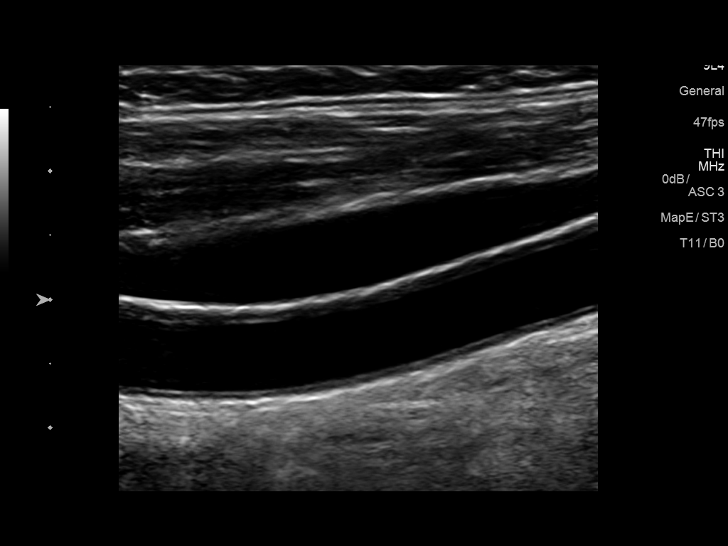
[im 12/44]
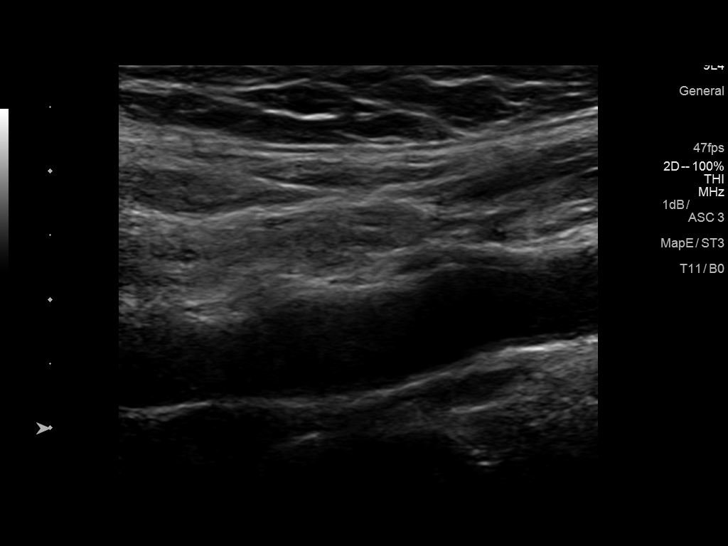
[im 15/44]
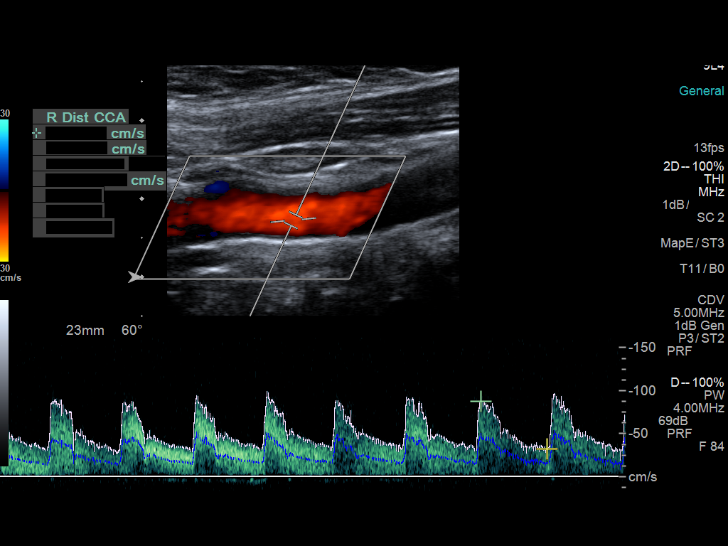
[im 19/44]
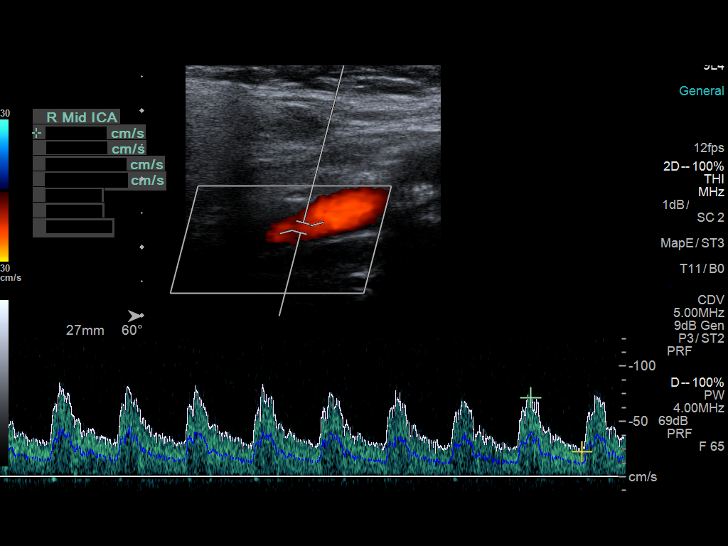
[im 23/44]
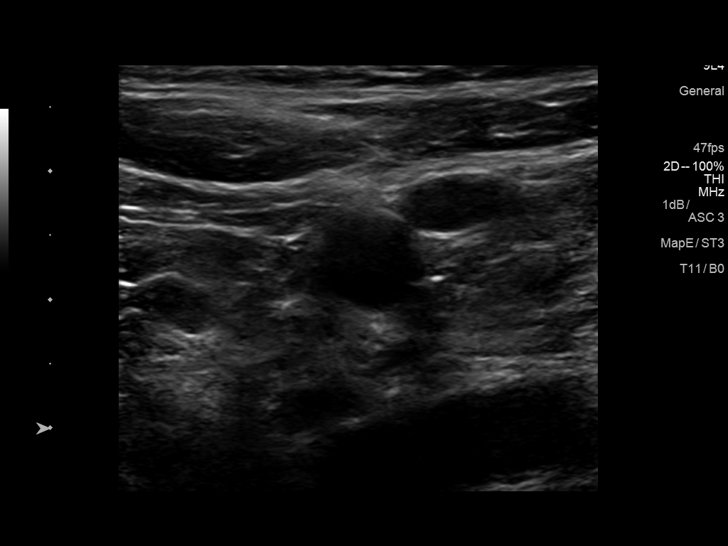
[im 25/44]
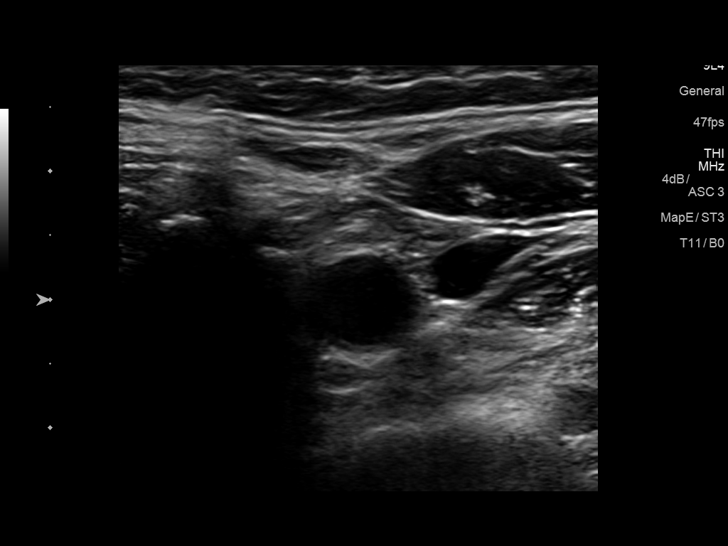
[im 29/44]
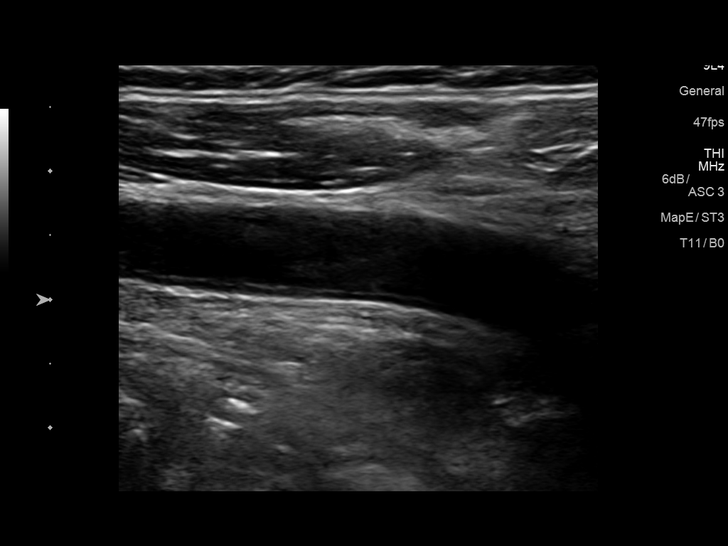
[im 32/44]
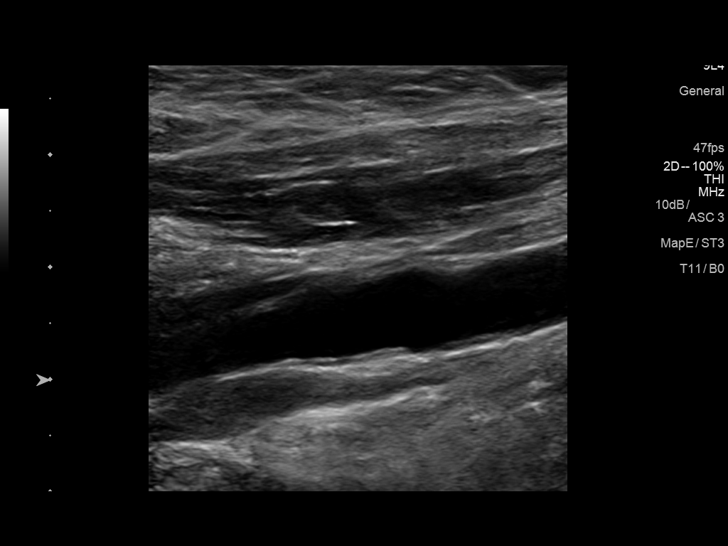
[im 36/44]
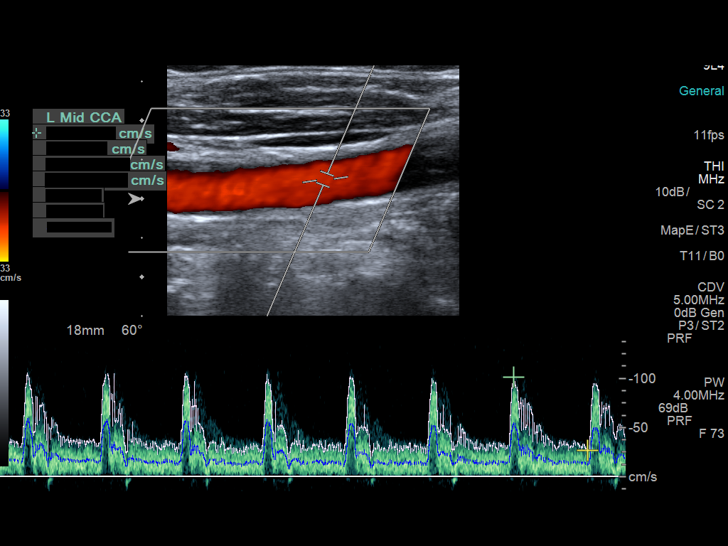
[im 40/44]
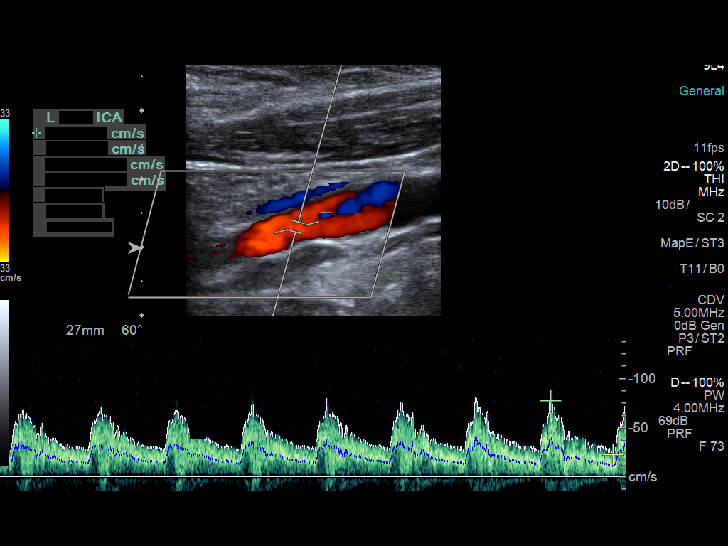
[im 44/44]
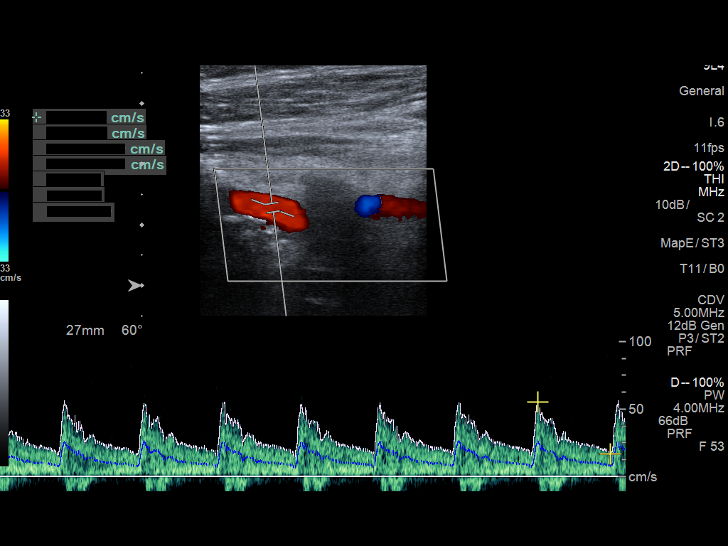

[13 of 24 positions shown; findings below may reference images not displayed]

FINDINGS: Criteria: Quantification of carotid stenosis is based on velocity
parameters that correlate the residual internal carotid diameter
with NASCET-based stenosis levels, using the diameter of the distal
internal carotid lumen as the denominator for stenosis measurement.

The following velocity measurements were obtained:

RIGHT

ICA: 78/24 cm/sec

CCA: 117/39 cm/sec

SYSTOLIC ICA/CCA RATIO:

ECA: 112 cm/sec

LEFT

ICA: 77/22 cm/sec

CCA: 120/32 cm/sec

SYSTOLIC ICA/CCA RATIO:

ECA: 95 cm/sec

RIGHT CAROTID ARTERY: Minor echogenic shadowing plaque formation. No
hemodynamically significant right ICA stenosis, velocity elevation,
or turbulent flow. Degree of narrowing less than 50%.

RIGHT VERTEBRAL ARTERY:  Antegrade

LEFT CAROTID ARTERY: Similar scattered minor echogenic plaque
formation. No hemodynamically significant left ICA stenosis,
velocity elevation, or turbulent flow.

LEFT VERTEBRAL ARTERY:  Antegrade

Upper extremity blood pressures: RIGHT: 125/80 LEFT: 140/86
IMPRESSION: Minor carotid atherosclerosis. No hemodynamically significant ICA
stenosis. Degree of narrowing less than 50% bilaterally by
ultrasound criteria.

Patent antegrade vertebral flow bilaterally

## 2020-11-28 ENCOUNTER — Other Ambulatory Visit: Payer: Self-pay | Admitting: Family Medicine

## 2020-11-28 ENCOUNTER — Other Ambulatory Visit (HOSPITAL_COMMUNITY): Payer: Self-pay | Admitting: Family Medicine

## 2020-11-28 DIAGNOSIS — M79661 Pain in right lower leg: Secondary | ICD-10-CM

## 2020-12-20 ENCOUNTER — Ambulatory Visit: Payer: No Typology Code available for payment source

## 2021-08-21 ENCOUNTER — Other Ambulatory Visit: Payer: Self-pay | Admitting: Obstetrics and Gynecology

## 2021-08-21 DIAGNOSIS — Z1231 Encounter for screening mammogram for malignant neoplasm of breast: Secondary | ICD-10-CM

## 2021-09-01 ENCOUNTER — Ambulatory Visit
Admission: RE | Admit: 2021-09-01 | Discharge: 2021-09-01 | Disposition: A | Discharge status: Home / Self Care | Payer: No Typology Code available for payment source | Source: Ambulatory Visit | Attending: Obstetrics and Gynecology | Admitting: Obstetrics and Gynecology

## 2021-09-01 DIAGNOSIS — Z1231 Encounter for screening mammogram for malignant neoplasm of breast: Secondary | ICD-10-CM

## 2022-01-17 ENCOUNTER — Encounter: Payer: Self-pay | Admitting: Podiatry

## 2022-01-17 ENCOUNTER — Ambulatory Visit (INDEPENDENT_AMBULATORY_CARE_PROVIDER_SITE_OTHER): Payer: No Typology Code available for payment source | Admitting: Podiatry

## 2022-01-17 DIAGNOSIS — Q828 Other specified congenital malformations of skin: Secondary | ICD-10-CM | POA: Diagnosis not present

## 2022-01-17 DIAGNOSIS — L852 Keratosis punctata (palmaris et plantaris): Secondary | ICD-10-CM

## 2022-01-17 DIAGNOSIS — M79671 Pain in right foot: Secondary | ICD-10-CM | POA: Diagnosis not present

## 2022-01-17 DIAGNOSIS — M79672 Pain in left foot: Secondary | ICD-10-CM

## 2022-01-17 NOTE — Progress Notes (Signed)
  Subjective:  Patient ID: Robin Fry, female    DOB: 02/19/1956,  MRN: [id number]  Chief Complaint  Patient presents with   Plantar Warts     (np) R spot on bottom, near ball of foot, L near 5th submet - warts vs. Porokeratosis     66 y.o. female presents with the above complaint. History confirmed with patient.   Objective:  Physical Exam: warm, good capillary refill, no trophic changes or ulcerative lesions, normal DP and PT pulses, normal sensory exam, and painful punctate hyperkeratotic lesions present submetatarsal 5 on the left, submetatarsal 2 bilateral  Assessment:   1. Punctate keratoderma   2. Porokeratosis   3. Pain in both feet      Plan:  Patient was evaluated and treated and all questions answered.  All symptomatic hyperkeratoses were safely debrided with a sterile #15 blade to patient's level of comfort without incident. We discussed preventative and palliative care of these lesions including supportive and accommodative shoegear, padding, prefabricated and custom molded accommodative orthoses, use of a pumice stone and lotions/creams daily.  I recommend use of urea cream 40% with 2% salicylic acid twice daily   Return if symptoms worsen or fail to improve.

## 2022-01-17 NOTE — Patient Instructions (Signed)
Look for urea 40% with 2% cream or ointment and apply to the thickened dry skin / calluses. This can be bought over the counter, at a pharmacy or online such as [employer]. If you have difficulty finding this let me know and I can order from a compounding pharmacy

## 2022-04-29 IMAGING — MG MM DIGITAL SCREENING BILAT W/ TOMO AND CAD
8 series · 9 of 24 positions shown · non-contrast
Comparison: Previous exam(s).

CLINICAL DATA: Screening.

EXAM:
DIGITAL SCREENING BILATERAL MAMMOGRAM WITH TOMOSYNTHESIS AND CAD
TECHNIQUE: Bilateral screening digital craniocaudal and mediolateral oblique
mammograms were obtained. Bilateral screening digital breast
tomosynthesis was performed. The images were evaluated with
computer-aided detection.

[R MLO synth-2D]
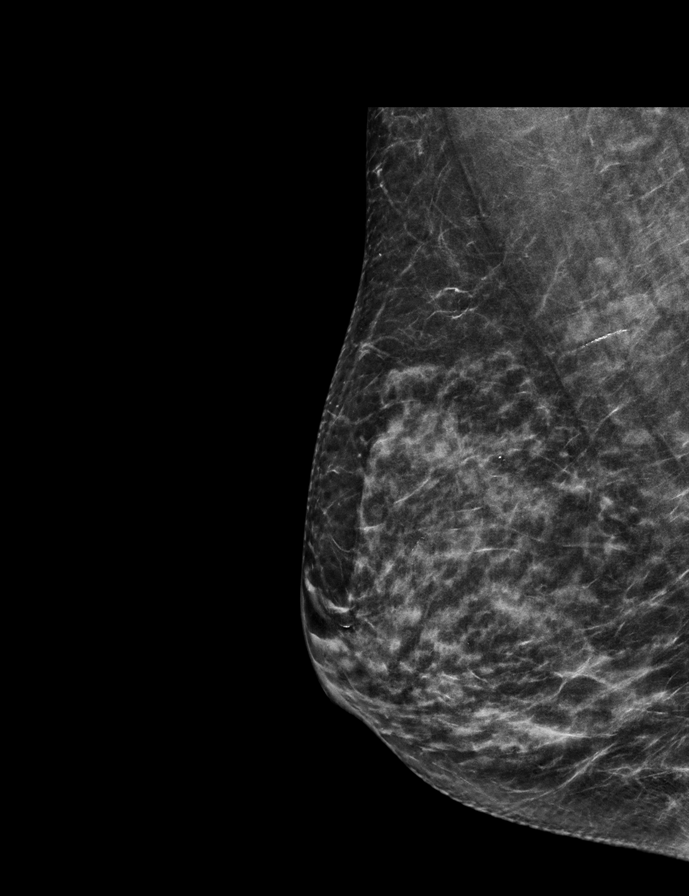

[L MLO synth-2D]
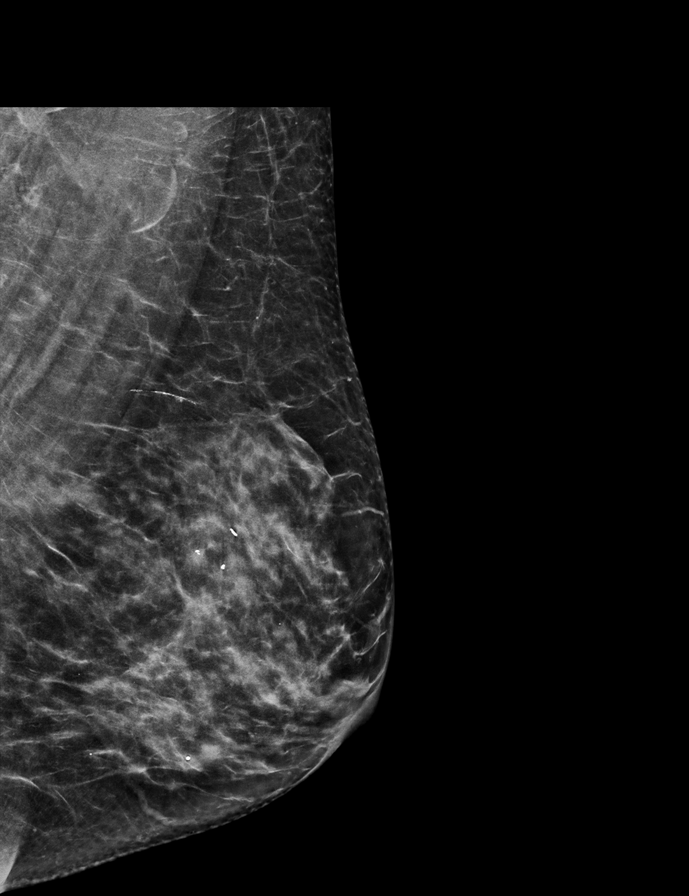

[R CC synth-2D]
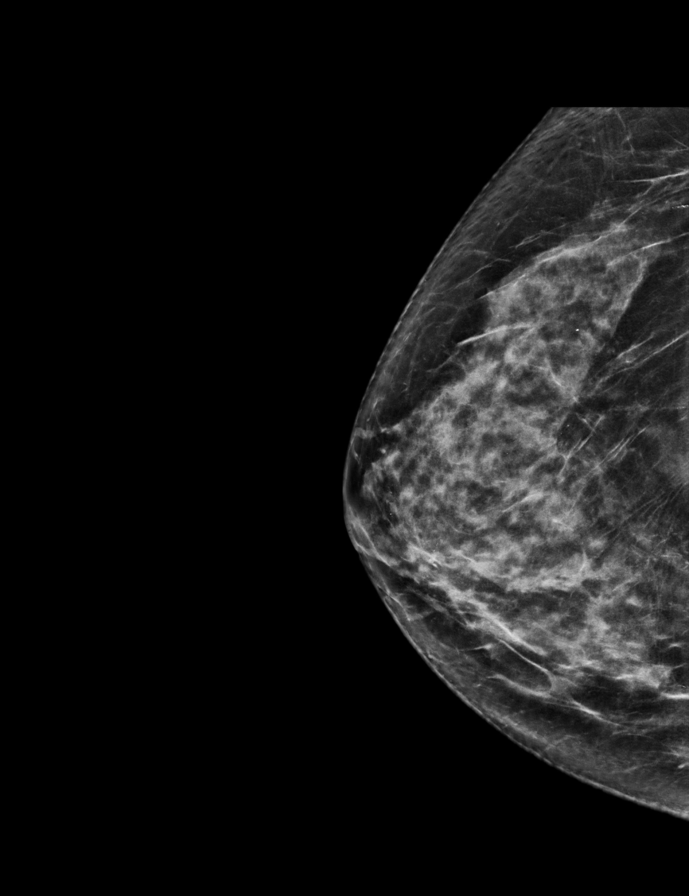

[L CC synth-2D]
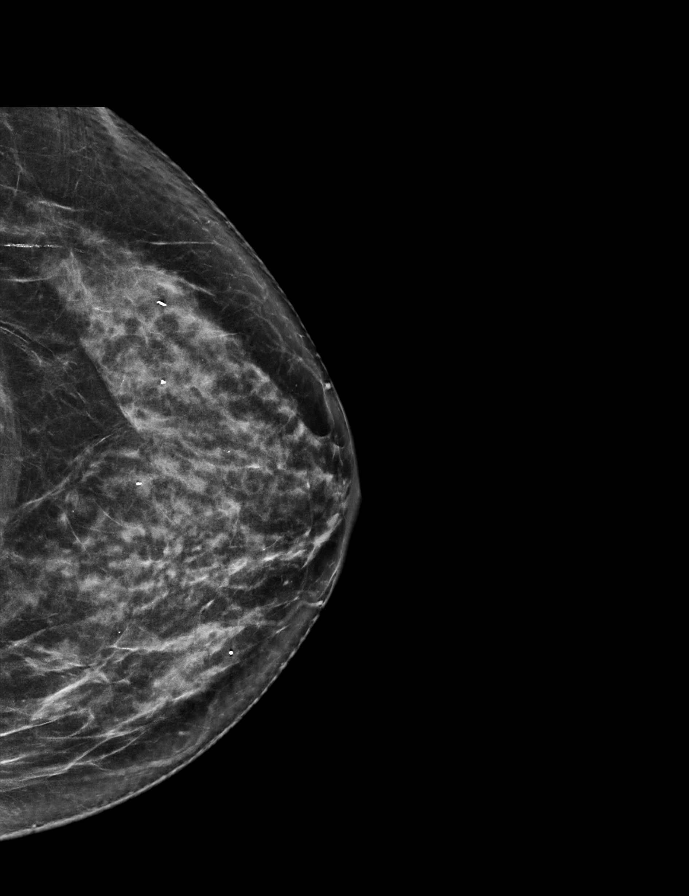

[R MLO tomo · 2 of 59 frames shown]
[frame 20/59]
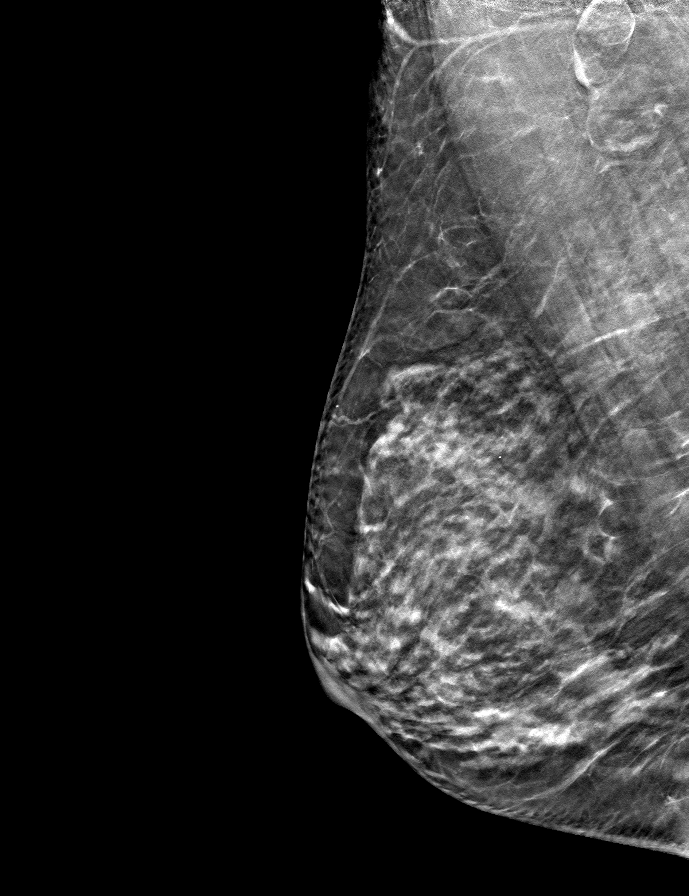
[frame 30/59]
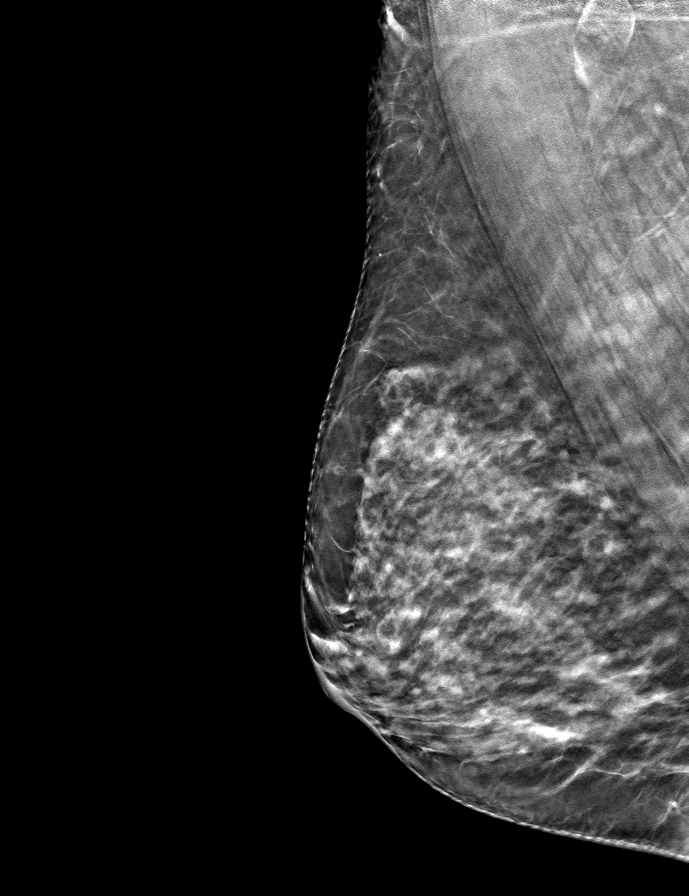

[L MLO tomo · tomo slice 34/67.0]
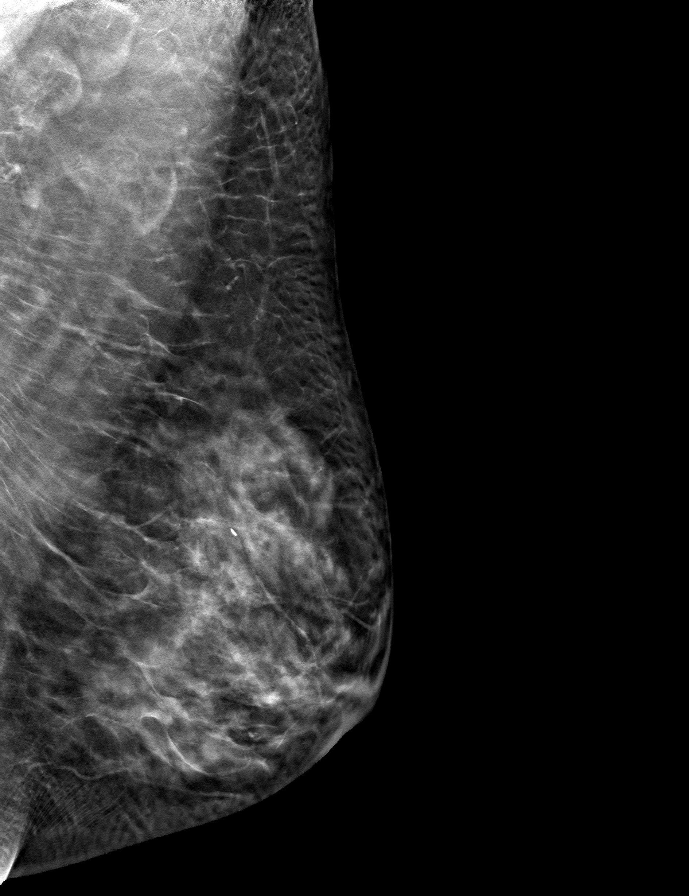

[R CC tomo · tomo slice 33/66.0]
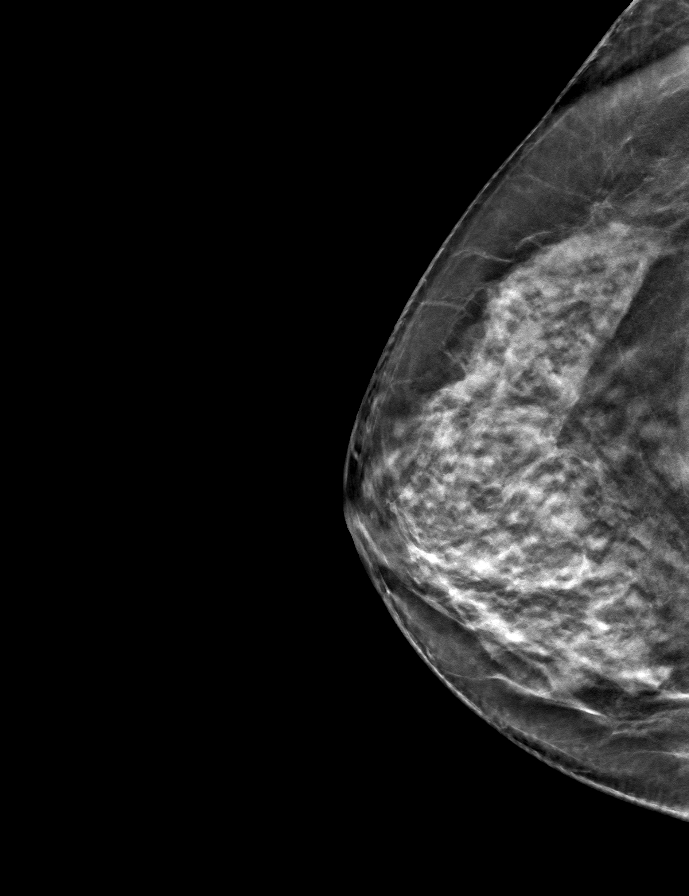

[L CC tomo · tomo slice 33/64.0]
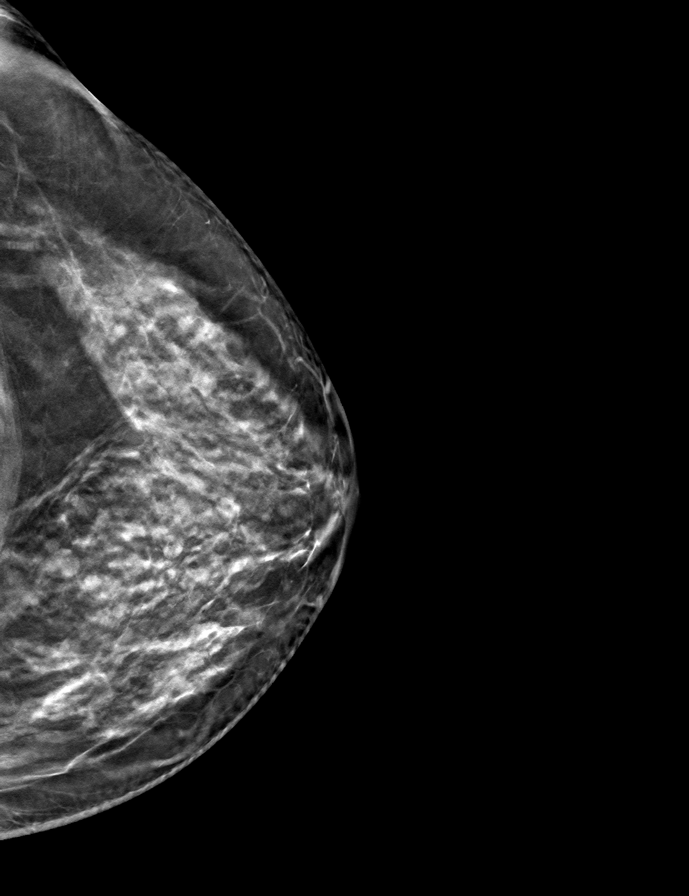

[9 of 24 positions shown; findings below may reference images not displayed]

ACR Breast Density Category c: The breast tissue is heterogeneously
dense, which may obscure small masses.
FINDINGS: There are no findings suspicious for malignancy.
IMPRESSION: No mammographic evidence of malignancy. A result letter of this
screening mammogram will be mailed directly to the patient.

RECOMMENDATION:
Screening mammogram in one year. (Code:Q3-W-BC3)

BI-RADS CATEGORY  1: Negative.

## 2022-07-24 ENCOUNTER — Ambulatory Visit (INDEPENDENT_AMBULATORY_CARE_PROVIDER_SITE_OTHER): Payer: No Typology Code available for payment source

## 2022-07-24 DIAGNOSIS — K2960 Other gastritis without bleeding: Secondary | ICD-10-CM | POA: Diagnosis present

## 2022-07-24 DIAGNOSIS — K222 Esophageal obstruction: Secondary | ICD-10-CM | POA: Diagnosis not present

## 2022-11-01 ENCOUNTER — Other Ambulatory Visit: Payer: Self-pay | Admitting: Obstetrics and Gynecology

## 2022-11-01 DIAGNOSIS — Z1231 Encounter for screening mammogram for malignant neoplasm of breast: Secondary | ICD-10-CM

## 2022-11-22 ENCOUNTER — Ambulatory Visit
Admission: RE | Admit: 2022-11-22 | Discharge: 2022-11-22 | Disposition: A | Discharge status: Home / Self Care | Payer: No Typology Code available for payment source | Source: Ambulatory Visit | Attending: Obstetrics and Gynecology | Admitting: Obstetrics and Gynecology

## 2022-11-22 DIAGNOSIS — Z1231 Encounter for screening mammogram for malignant neoplasm of breast: Secondary | ICD-10-CM

## 2022-11-26 ENCOUNTER — Other Ambulatory Visit: Payer: Self-pay | Admitting: Obstetrics and Gynecology

## 2022-11-26 DIAGNOSIS — R928 Other abnormal and inconclusive findings on diagnostic imaging of breast: Secondary | ICD-10-CM

## 2022-12-06 ENCOUNTER — Ambulatory Visit
Admission: RE | Admit: 2022-12-06 | Discharge: 2022-12-06 | Disposition: A | Discharge status: Home / Self Care | Payer: No Typology Code available for payment source | Source: Ambulatory Visit | Attending: Obstetrics and Gynecology | Admitting: Obstetrics and Gynecology

## 2022-12-06 ENCOUNTER — Other Ambulatory Visit: Payer: Self-pay | Admitting: Obstetrics and Gynecology

## 2022-12-06 ENCOUNTER — Ambulatory Visit: Payer: No Typology Code available for payment source

## 2022-12-06 DIAGNOSIS — R928 Other abnormal and inconclusive findings on diagnostic imaging of breast: Secondary | ICD-10-CM

## 2022-12-06 DIAGNOSIS — N63 Unspecified lump in unspecified breast: Secondary | ICD-10-CM

## 2023-06-14 ENCOUNTER — Ambulatory Visit
Admission: RE | Admit: 2023-06-14 | Discharge: 2023-06-14 | Disposition: A | Discharge status: Home / Self Care | Payer: No Typology Code available for payment source | Source: Ambulatory Visit | Attending: Obstetrics and Gynecology | Admitting: Obstetrics and Gynecology

## 2023-06-14 ENCOUNTER — Other Ambulatory Visit: Payer: Self-pay | Admitting: Obstetrics and Gynecology

## 2023-06-14 DIAGNOSIS — R928 Other abnormal and inconclusive findings on diagnostic imaging of breast: Secondary | ICD-10-CM

## 2023-06-14 DIAGNOSIS — N63 Unspecified lump in unspecified breast: Secondary | ICD-10-CM

## 2023-12-13 ENCOUNTER — Ambulatory Visit
Admission: RE | Admit: 2023-12-13 | Discharge: 2023-12-13 | Disposition: A | Discharge status: Home / Self Care | Source: Ambulatory Visit | Attending: Obstetrics and Gynecology | Admitting: Obstetrics and Gynecology

## 2023-12-13 DIAGNOSIS — N63 Unspecified lump in unspecified breast: Secondary | ICD-10-CM
